# Patient Record
Sex: Male | Born: 1960 | Race: Black or African American | Hispanic: No | Marital: Married | Smoking: Never smoker
Health system: Southern US, Community
[De-identification: ages and names within clinical notes are randomized; demographics above are authoritative.]

## PROBLEM LIST (undated history)

## (undated) DIAGNOSIS — G4730 Sleep apnea, unspecified: Secondary | ICD-10-CM

## (undated) HISTORY — DX: Sleep apnea, unspecified: G47.30

---

## 2003-04-28 ENCOUNTER — Encounter: Admission: EM | Admit: 2003-04-28 | Discharge: 2003-04-28 | Payer: Self-pay | Admitting: Family Medicine

## 2008-01-21 ENCOUNTER — Ambulatory Visit (HOSPITAL_BASED_OUTPATIENT_CLINIC_OR_DEPARTMENT_OTHER): Admission: RE | Admit: 2008-01-21 | Discharge: 2008-01-21 | Payer: Self-pay | Admitting: Family Medicine

## 2018-02-13 ENCOUNTER — Other Ambulatory Visit: Payer: Self-pay | Admitting: Family Medicine

## 2018-02-13 DIAGNOSIS — R1031 Right lower quadrant pain: Secondary | ICD-10-CM

## 2018-02-14 ENCOUNTER — Observation Stay (HOSPITAL_COMMUNITY)
Admission: EM | Admit: 2018-02-14 | Discharge: 2018-02-17 | Disposition: A | Discharge status: Home / Self Care | Payer: BLUE CROSS/BLUE SHIELD | Attending: General Surgery | Admitting: General Surgery

## 2018-02-14 ENCOUNTER — Encounter (HOSPITAL_COMMUNITY): Payer: Self-pay

## 2018-02-14 ENCOUNTER — Other Ambulatory Visit: Payer: Self-pay

## 2018-02-14 ENCOUNTER — Ambulatory Visit
Admission: RE | Admit: 2018-02-14 | Discharge: 2018-02-14 | Disposition: A | Discharge status: Home / Self Care | Payer: BLUE CROSS/BLUE SHIELD | Source: Ambulatory Visit | Attending: Family Medicine | Admitting: Family Medicine

## 2018-02-14 DIAGNOSIS — K3532 Acute appendicitis with perforation and localized peritonitis, without abscess: Principal | ICD-10-CM | POA: Insufficient documentation

## 2018-02-14 DIAGNOSIS — K429 Umbilical hernia without obstruction or gangrene: Secondary | ICD-10-CM | POA: Insufficient documentation

## 2018-02-14 DIAGNOSIS — N289 Disorder of kidney and ureter, unspecified: Secondary | ICD-10-CM | POA: Diagnosis not present

## 2018-02-14 DIAGNOSIS — K3580 Unspecified acute appendicitis: Secondary | ICD-10-CM | POA: Diagnosis present

## 2018-02-14 DIAGNOSIS — R1031 Right lower quadrant pain: Secondary | ICD-10-CM

## 2018-02-14 DIAGNOSIS — E860 Dehydration: Secondary | ICD-10-CM | POA: Diagnosis not present

## 2018-02-14 LAB — COMPREHENSIVE METABOLIC PANEL
ALT: 27 U/L (ref 0–44)
AST: 24 U/L (ref 15–41)
Albumin: 3.4 g/dL — ABNORMAL LOW (ref 3.5–5.0)
Alkaline Phosphatase: 71 U/L (ref 38–126)
Anion gap: 7 (ref 5–15)
BUN: 13 mg/dL (ref 6–20)
CO2: 26 mmol/L (ref 22–32)
Calcium: 9.5 mg/dL (ref 8.9–10.3)
Chloride: 103 mmol/L (ref 98–111)
Creatinine, Ser: 1.28 mg/dL — ABNORMAL HIGH (ref 0.61–1.24)
GFR calc Af Amer: >60 mL/min (ref >60)
GFR calc non Af Amer: >60 mL/min (ref >60)
Glucose, Bld: 120 mg/dL — ABNORMAL HIGH (ref 70–99)
Potassium: 3.6 mmol/L (ref 3.5–5.1)
Sodium: 136 mmol/L (ref 135–145)
Total Bilirubin: 0.5 mg/dL (ref 0.3–1.2)
Total Protein: 7.2 g/dL (ref 6.5–8.1)

## 2018-02-14 LAB — CBC WITH DIFFERENTIAL/PLATELET
Abs Immature Granulocytes: 0.03 10*3/uL (ref 0.00–0.07)
Basophils Absolute: 0 10*3/uL (ref 0.0–0.1)
Basophils Relative: 1 %
Eosinophils Absolute: 0.6 10*3/uL — ABNORMAL HIGH (ref 0.0–0.5)
Eosinophils Relative: 7 %
HCT: 41.1 % (ref 39.0–52.0)
Hemoglobin: 13.4 g/dL (ref 13.0–17.0)
Immature Granulocytes: 0 %
Lymphocytes Relative: 20 %
Lymphs Abs: 1.7 10*3/uL (ref 0.7–4.0)
MCH: 26.1 pg (ref 26.0–34.0)
MCHC: 32.6 g/dL (ref 30.0–36.0)
MCV: 80 fL (ref 80.0–100.0)
Monocytes Absolute: 0.6 10*3/uL (ref 0.1–1.0)
Monocytes Relative: 7 %
Neutro Abs: 5.7 10*3/uL (ref 1.7–7.7)
Neutrophils Relative %: 65 %
Platelets: 281 10*3/uL (ref 150–400)
RBC: 5.14 MIL/uL (ref 4.22–5.81)
RDW: 12.3 % (ref 11.5–15.5)
WBC: 8.6 10*3/uL (ref 4.0–10.5)
nRBC: 0 % (ref 0.0–0.2)

## 2018-02-14 LAB — LIPASE, BLOOD: Lipase: 29 U/L (ref 11–51)

## 2018-02-14 MED ORDER — ONDANSETRON 4 MG PO TBDP: 4.0000 mg | ORAL_TABLET | Freq: Four times a day (QID) | ORAL | Status: DC | PRN

## 2018-02-14 MED ORDER — SODIUM CHLORIDE 0.9 % IV SOLN
2.0000 g | Freq: Once | INTRAVENOUS | Status: AC
  Administered 2018-02-14: 2 g via INTRAVENOUS
  Filled 2018-02-14: qty 20

## 2018-02-14 MED ORDER — POTASSIUM CHLORIDE IN NACL 20-0.9 MEQ/L-% IV SOLN
INTRAVENOUS | Status: DC
  Administered 2018-02-15: via INTRAVENOUS
  Filled 2018-02-14: qty 1000

## 2018-02-14 MED ORDER — IOPAMIDOL (ISOVUE-300) INJECTION 61%
125.0000 mL | Freq: Once | INTRAVENOUS | Status: AC | PRN
  Administered 2018-02-14: 125 mL via INTRAVENOUS

## 2018-02-14 MED ORDER — MORPHINE SULFATE (PF) 2 MG/ML IV SOLN
2.0000 mg | INTRAVENOUS | Status: DC | PRN
  Administered 2018-02-15: 2 mg via INTRAVENOUS
  Filled 2018-02-14: qty 1

## 2018-02-14 MED ORDER — METRONIDAZOLE IN NACL 5-0.79 MG/ML-% IV SOLN
500.0000 mg | Freq: Once | INTRAVENOUS | Status: AC
  Administered 2018-02-14: 500 mg via INTRAVENOUS
  Filled 2018-02-14: qty 100

## 2018-02-14 MED ORDER — ENOXAPARIN SODIUM 40 MG/0.4ML ~~LOC~~ SOLN: 40.0000 mg | Freq: Every day | SUBCUTANEOUS | Status: DC

## 2018-02-14 MED ORDER — CEFTRIAXONE SODIUM 2 G IJ SOLR
2.0000 g | INTRAMUSCULAR | Status: DC
  Filled 2018-02-14: qty 20

## 2018-02-14 MED ORDER — ONDANSETRON HCL 4 MG/2ML IJ SOLN
4.0000 mg | Freq: Four times a day (QID) | INTRAMUSCULAR | Status: DC | PRN
  Administered 2018-02-15: 4 mg via INTRAVENOUS
  Filled 2018-02-14: qty 2

## 2018-02-14 MED ORDER — SODIUM CHLORIDE 0.9 % IV BOLUS
1000.0000 mL | Freq: Once | INTRAVENOUS | Status: AC
  Administered 2018-02-14: 1000 mL via INTRAVENOUS

## 2018-02-14 MED ORDER — METRONIDAZOLE IN NACL 5-0.79 MG/ML-% IV SOLN
500.0000 mg | Freq: Three times a day (TID) | INTRAVENOUS | Status: DC
  Administered 2018-02-15 (×2): 500 mg via INTRAVENOUS
  Filled 2018-02-14 (×3): qty 100

## 2018-02-14 NOTE — ED Triage Notes (Signed)
Pt endorses RLQ pain x 5 days, denies n/v/d or fever. VSS. Afebrile. Thinks his appendix is inflamed.

## 2018-02-14 NOTE — ED Provider Notes (Signed)
Patient placed in Quick Look pathway, seen and evaluated   Chief Complaint: RLQ pain  HPI:   Patient reports right lower quadrant pain for about 5 days.  He went to His PCP today who ordered labs, CT scan.  He had his last oral intake approximately 1 hour ago which was a [place] sandwich water.  He denies any fevers.  He reports that he received a call saying that he needs to come to the emergency room.  CT scan reviewed, shows acute appendicitis.  He has no history of abdominal surgery.  States he does not need nausea or pain medicine at this time.  He reports his urine has been slightly darker.  ROS: No fevers (one)  Physical Exam:   Gen: No distress  Neuro: Awake and Alert  Skin: Warm    Focused Exam: RLQ TTP.     Initiation of care has begun. The patient has been counseled on the process, plan, and necessity for staying for the completion/evaluation, and the remainder of the medical screening examination    Ollen Gross 02/14/18 Dione Booze, DO 02/14/18 2253

## 2018-02-14 NOTE — Progress Notes (Addendum)
Patient was @ [facility] on 02/14/18 for CT scan of abd and pelvis.  With a positive finding of appendicitis, Dr Kenton Kingfisher expressed that the patient be taken by EMS to [facility] did not want to do this and was transported by his wife.

## 2018-02-14 NOTE — ED Provider Notes (Signed)
[facility] EMERGENCY DEPARTMENT Provider Note   CSN: [id number] Arrival date & time: 02/14/18  [place]     History   Chief Complaint Chief Complaint  Patient presents with  . Abdominal Pain    HPI Christopher Clements is a 57 y.o. male.  HPI   57 year old male presents today with reports of appendicitis.  Patient notes 5 days of right lower quadrant abdominal pain.  He notes this has continued to persist.  He was seen by his primary care provider today who drew labs as an outpatient showing a white count of 13, and a CT showing acute appendicitis.  Patient notes that shortly after the CT scan he went and had a full meal (1.5 hours prior to my evaluation).  He denies any associated nausea or vomiting, denies any fever, reports normal bowel movements.  No history of abdominal surgeries, no chronic health conditions and no daily medications.       History reviewed. No pertinent past medical history.  There are no active problems to display for this patient.   History reviewed. No pertinent surgical history.      Home Medications    Prior to Admission medications   Not on File    Family History History reviewed. No pertinent family history.  Social History Social History   Tobacco Use  . Smoking status: Never Smoker  Substance Use Topics  . Alcohol use: Never    Frequency: Never  . Drug use: Never     Allergies   Patient has no known allergies.   Review of Systems Review of Systems  All other systems reviewed and are negative.   Physical Exam Updated Vital Signs BP 134/85 (BP Location: Right Arm)   Pulse 94   Temp 97.9 F (36.6 C) (Oral)   Resp 16   Ht 6\' 2"  (1.88 m)   Wt 124.7 kg   SpO2 99%   BMI 35.31 kg/m   Physical Exam  Constitutional: He is oriented to person, place, and time. He appears well-developed and well-nourished.  HENT:  Head: Normocephalic and atraumatic.  Eyes: Pupils are equal, round, and reactive to light.  Conjunctivae are normal. Right eye exhibits no discharge. Left eye exhibits no discharge. No scleral icterus.  Neck: Normal range of motion. No JVD present. No tracheal deviation present.  Pulmonary/Chest: Effort normal. No stridor.  Abdominal:  Minimal tenderness to palpation of right lower quadrant-remainder of abdomen soft nontender  Neurological: He is alert and oriented to person, place, and time. Coordination normal.  Psychiatric: He has a normal mood and affect. His behavior is normal. Judgment and thought content normal.  Nursing note and vitals reviewed.    ED Treatments / Results  Labs (all labs ordered are listed, but only abnormal results are displayed) Labs Reviewed  COMPREHENSIVE METABOLIC PANEL - Abnormal; Notable for the following components:      Result Value   Glucose, Bld 120 (*)    Creatinine, Ser 1.28 (*)    Albumin 3.4 (*)    All other components within normal limits  CBC WITH DIFFERENTIAL/PLATELET - Abnormal; Notable for the following components:   Eosinophils Absolute 0.6 (*)    All other components within normal limits  LIPASE, BLOOD    EKG None  Radiology Ct Abdomen Pelvis W Contrast  Result Date: 02/14/2018 CLINICAL DATA:  Right lower quadrant pain for 5 days. EXAM: CT ABDOMEN AND PELVIS WITH CONTRAST TECHNIQUE: Multidetector CT imaging of the abdomen and pelvis was performed  using the standard protocol following bolus administration of intravenous contrast. CONTRAST:  182mL ISOVUE-300 IOPAMIDOL (ISOVUE-300) INJECTION 61% COMPARISON:  None. FINDINGS: Lower chest: No acute abnormality. Hepatobiliary: Mild steatosis of the liver without space-occupying mass. No biliary dilatation. Contracted gallbladder without stones. Pancreas: Unremarkable. No pancreatic ductal dilatation or surrounding inflammatory changes. Spleen: Normal in size without focal abnormality. Adrenals/Urinary Tract: Adrenal glands are unremarkable. 8 mm cyst in the interpolar right kidney.  No enhancing mass lesion of either kidney. No nephrolithiasis nor obstructive uropathy is identified. The urinary bladder is unremarkable for the degree of distention. Stomach/Bowel: Abnormal appearance of the appendix. Appendix: Location: The appendix projects medially within the right lower quadrant and upper pelvis. Diameter: Up to 2.4 cm in diameter Appendicolith: There are one or two suggested along the midportion measuring up to 7 mm, series 6/62 and 63. Mucosal hyper-enhancement: Yes Extraluminal gas: No Periappendiceal collection: Moderate degree of periappendiceal inflammation without definite collection noted. Small hiatal hernia. Physiologic appearance of the stomach and small intestine with normal bowel rotation. Moderate stool retention within the colon. No bowel obstruction or inflammation. Vascular/Lymphatic: Enlarged periappendiceal lymph node measuring up to 1 cm short axis, series 2/60 smaller lymph nodes present in the right lower quadrant. Reproductive: Prostate and seminal vesicles are unremarkable. Other: Small amount of fat within both inguinal canals. Periumbilical fat containing hernia with slight indurated appearance of the fat. Incarceration of the fat within the hernia is not excluded. Musculoskeletal: Degenerative disc disease L4-5. No acute nor aggressive osseous lesions. IMPRESSION: Acute appendicitis with the appendix measuring up to 2.4 cm in diameter with moderate degree of periappendiceal inflammation but without definite perforation or abscess. Ancillary findings as above. These results will be called to the ordering clinician or representative by the Radiologist Assistant, and communication documented in the PACS or zVision Dashboard. Electronically Signed   By: Ashley Royalty M.D.   On: 02/14/2018 16:03    Procedures Procedures (including critical care time)  Medications Ordered in ED Medications  cefTRIAXone (ROCEPHIN) 2 g in sodium chloride 0.9 % 100 mL IVPB (0 g  Intravenous Stopped 02/14/18 2003)    And  metroNIDAZOLE (FLAGYL) IVPB 500 mg (500 mg Intravenous New Bag/Given 02/14/18 2018)  sodium chloride 0.9 % bolus 1,000 mL (0 mLs Intravenous Stopped 02/14/18 2034)     Initial Impression / Assessment and Plan / ED Course  I have reviewed the triage vital signs and the nursing notes.  Pertinent labs & imaging results that were available during my care of the patient were reviewed by me and considered in my medical decision making (see chart for details).     Labs: CMP, lipase, CBC  Imaging: CT abdomen pelvis with contrast as an outpatient  Consults:  Therapeutics: Metronidazole, ceftriaxone  Discharge Meds:   Assessment/Plan: 56 year old male presents today with what appears to be acute appendicitis with no signs of significant complications at this time.  Patient is afebrile with no white count with very minimal abdominal pain.  He has a CT showing acute appendicitis.  Patient was started on antibiotics, general surgery consulted to evaluate patient at bedside.    Final Clinical Impressions(s) / ED Diagnoses   Final diagnoses:  Acute appendicitis, unspecified acute appendicitis type    ED Discharge Orders    None       Francee Gentile 02/14/18 2112    Sherwood Gambler, MD 02/14/18 2202

## 2018-02-14 NOTE — H&P (Signed)
Christopher Clements is an 57 y.o. male.   Chief Complaint: RLQ pain HPI: This is a healthy 57 year old male who presents with a 5-6 day history of persistent RLQ abdominal pain.  No nausea or vomiting.  Mildly decreased appetite, although he is more hungry today.  He felt tired and weak over the weekend, so he went to his PCP on Monday.  Reportedly, his WBC was 13 so a CT scan was ordered.  It was not done until today.  He was instructed to come immediately to the ED for evaluation.  No fever.  Last meal about 3 hours ago.    History reviewed. No pertinent past medical history.  History reviewed. No pertinent surgical history.  History reviewed. No pertinent family history. Social History:  reports that he has never smoked. He does not have any smokeless tobacco history on file. He reports that he does not drink alcohol or use drugs.  Allergies: No Known Allergies  Prior to Admission medications   Not on File     Results for orders placed or performed during the hospital encounter of 02/14/18 (from the past 48 hour(s))  Comprehensive metabolic panel     Status: Abnormal   Collection Time: 02/14/18  6:51 PM  Result Value Ref Range   Sodium 136 135 - 145 mmol/L   Potassium 3.6 3.5 - 5.1 mmol/L   Chloride 103 98 - 111 mmol/L   CO2 26 22 - 32 mmol/L   Glucose, Bld 120 (H) 70 - 99 mg/dL   BUN 13 6 - 20 mg/dL   Creatinine, Ser 1.28 (H) 0.61 - 1.24 mg/dL   Calcium 9.5 8.9 - 10.3 mg/dL   Total Protein 7.2 6.5 - 8.1 g/dL   Albumin 3.4 (L) 3.5 - 5.0 g/dL   AST 24 15 - 41 U/L   ALT 27 0 - 44 U/L   Alkaline Phosphatase 71 38 - 126 U/L   Total Bilirubin 0.5 0.3 - 1.2 mg/dL   GFR calc non Af Amer >60 >60 mL/min   GFR calc Af Amer >60 >60 mL/min    Comment: (NOTE) The eGFR has been calculated using the CKD EPI equation. This calculation has not been validated in all clinical situations. eGFR's persistently <60 mL/min signify possible Chronic Kidney Disease.    Anion gap 7 5 - 15    Comment:  Performed at [facility] [address], [place], [place] [address]  Lipase, blood     Status: None   Collection Time: 02/14/18  6:51 PM  Result Value Ref Range   Lipase 29 11 - 51 U/L    Comment: Performed at [facility], [place] [address], [place] [address]  CBC with Differential     Status: Abnormal   Collection Time: 02/14/18  6:51 PM  Result Value Ref Range   WBC 8.6 4.0 - 10.5 K/uL   RBC 5.14 4.22 - 5.81 MIL/uL   Hemoglobin 13.4 13.0 - 17.0 g/dL   HCT 41.1 39.0 - 52.0 %   MCV 80.0 80.0 - 100.0 fL   MCH 26.1 26.0 - 34.0 pg   MCHC 32.6 30.0 - 36.0 g/dL   RDW 12.3 11.5 - 15.5 %   Platelets 281 150 - 400 K/uL   nRBC 0.0 0.0 - 0.2 %   Neutrophils Relative % 65 %   Neutro Abs 5.7 1.7 - 7.7 K/uL   Lymphocytes Relative 20 %   Lymphs Abs 1.7 0.7 -  4.0 K/uL   Monocytes Relative 7 %   Monocytes Absolute 0.6 0.1 - 1.0 K/uL   Eosinophils Relative 7 %   Eosinophils Absolute 0.6 (H) 0.0 - 0.5 K/uL   Basophils Relative 1 %   Basophils Absolute 0.0 0.0 - 0.1 K/uL   Immature Granulocytes 0 %   Abs Immature Granulocytes 0.03 0.00 - 0.07 K/uL    Comment: Performed at [facility] [address], [place], [place] [address]   Ct Abdomen Pelvis W Contrast  Result Date: 02/14/2018 CLINICAL DATA:  Right lower quadrant pain for 5 days. EXAM: CT ABDOMEN AND PELVIS WITH CONTRAST TECHNIQUE: Multidetector CT imaging of the abdomen and pelvis was performed using the standard protocol following bolus administration of intravenous contrast. CONTRAST:  171m ISOVUE-300 IOPAMIDOL (ISOVUE-300) INJECTION 61% COMPARISON:  None. FINDINGS: Lower chest: No acute abnormality. Hepatobiliary: Mild steatosis of the liver without space-occupying mass. No biliary dilatation. Contracted gallbladder without stones. Pancreas: Unremarkable. No pancreatic ductal dilatation or surrounding inflammatory changes. Spleen: Normal in size without focal abnormality. Adrenals/Urinary Tract: Adrenal glands  are unremarkable. 8 mm cyst in the interpolar right kidney. No enhancing mass lesion of either kidney. No nephrolithiasis nor obstructive uropathy is identified. The urinary bladder is unremarkable for the degree of distention. Stomach/Bowel: Abnormal appearance of the appendix. Appendix: Location: The appendix projects medially within the right lower quadrant and upper pelvis. Diameter: Up to 2.4 cm in diameter Appendicolith: There are one or two suggested along the midportion measuring up to 7 mm, series 6/62 and 63. Mucosal hyper-enhancement: Yes Extraluminal gas: No Periappendiceal collection: Moderate degree of periappendiceal inflammation without definite collection noted. Small hiatal hernia. Physiologic appearance of the stomach and small intestine with normal bowel rotation. Moderate stool retention within the colon. No bowel obstruction or inflammation. Vascular/Lymphatic: Enlarged periappendiceal lymph node measuring up to 1 cm short axis, series 2/60 smaller lymph nodes present in the right lower quadrant. Reproductive: Prostate and seminal vesicles are unremarkable. Other: Small amount of fat within both inguinal canals. Periumbilical fat containing hernia with slight indurated appearance of the fat. Incarceration of the fat within the hernia is not excluded. Musculoskeletal: Degenerative disc disease L4-5. No acute nor aggressive osseous lesions. IMPRESSION: Acute appendicitis with the appendix measuring up to 2.4 cm in diameter with moderate degree of periappendiceal inflammation but without definite perforation or abscess. Ancillary findings as above. These results will be called to the ordering clinician or representative by the Radiologist Assistant, and communication documented in the PACS or zVision Dashboard. Electronically Signed   By: DAshley RoyaltyM.D.   On: 02/14/2018 16:03    Review of Systems  Constitutional: Negative for weight loss.  HENT: Negative for ear discharge, ear pain, hearing  loss and tinnitus.   Eyes: Negative for blurred vision, double vision, photophobia and pain.  Respiratory: Negative for cough, sputum production and shortness of breath.   Cardiovascular: Negative for chest pain.  Gastrointestinal: Positive for abdominal pain. Negative for nausea and vomiting.  Genitourinary: Negative for dysuria, flank pain, frequency and urgency.  Musculoskeletal: Negative for back pain, falls, joint pain, myalgias and neck pain.  Neurological: Negative for dizziness, tingling, sensory change, focal weakness, loss of consciousness and headaches.  Endo/Heme/Allergies: Does not bruise/bleed easily.  Psychiatric/Behavioral: Negative for depression, memory loss and substance abuse. The patient is not nervous/anxious.     Blood pressure 134/85, pulse 94, temperature 97.9 F (36.6 C), temperature source Oral, resp. rate 16, height _0  (1.88 m), weight 124.7 kg, SpO2  99 %. Physical Exam  WDWN in NAD Eyes:  Pupils equal, round; sclera anicteric HENT:  Oral mucosa moist; good dentition  Neck:  No masses palpated, no thyromegaly Lungs:  CTA bilaterally; normal respiratory effort CV:  Regular rate and rhythm; no murmurs; extremities well-perfused with no edema Abd:  +bowel sounds, soft, tender in RLQ at McBurney's point.  Reducible umbilical hernia.  No peritonitis Skin:  Warm, dry; no sign of jaundice Psychiatric - alert and oriented x 4; calm mood and affect  Assessment/Plan Acute appendicitis without CT evidence of rupture or abscess Reducible umbilical hernia  Admit for IV hydration, IV abx Laparoscopic appendectomy and primary repair of umbilical hernia tomorrow by Dr. Rosendo Gros.  The surgical procedure has been discussed with the patient.  Potential risks, benefits, alternative treatments, and expected outcomes have been explained.  All of the patient's questions at this time have been answered.  The likelihood of reaching the patient's treatment goal is good.  The patient  understand the proposed surgical procedure and wishes to proceed.   Maia Petties, MD 02/14/2018, 9:36 PM

## 2018-02-15 ENCOUNTER — Observation Stay (HOSPITAL_COMMUNITY): Payer: BLUE CROSS/BLUE SHIELD | Admitting: Certified Registered"

## 2018-02-15 ENCOUNTER — Encounter (HOSPITAL_COMMUNITY)
Admission: EM | Disposition: A | Discharge status: Home / Self Care | Payer: Self-pay | Source: Home / Self Care | Attending: Emergency Medicine

## 2018-02-15 ENCOUNTER — Other Ambulatory Visit: Payer: Self-pay

## 2018-02-15 ENCOUNTER — Encounter (HOSPITAL_COMMUNITY): Payer: Self-pay | Admitting: *Deleted

## 2018-02-15 HISTORY — PX: LAPAROSCOPIC APPENDECTOMY: SHX408

## 2018-02-15 LAB — SURGICAL PCR SCREEN
MRSA, PCR: NEGATIVE
STAPHYLOCOCCUS AUREUS: NEGATIVE

## 2018-02-15 LAB — HIV ANTIBODY (ROUTINE TESTING W REFLEX): HIV Screen 4th Generation wRfx: NONREACTIVE

## 2018-02-15 SURGERY — APPENDECTOMY, LAPAROSCOPIC
Anesthesia: General | Site: Abdomen

## 2018-02-15 MED ORDER — HYDROMORPHONE HCL 1 MG/ML IJ SOLN
0.2500 mg | INTRAMUSCULAR | Status: DC | PRN
  Administered 2018-02-15 (×2): 0.5 mg via INTRAVENOUS

## 2018-02-15 MED ORDER — ONDANSETRON HCL 4 MG/2ML IJ SOLN
INTRAMUSCULAR | Status: DC | PRN
  Administered 2018-02-15: 4 mg via INTRAVENOUS

## 2018-02-15 MED ORDER — DEXMEDETOMIDINE HCL IN NACL 200 MCG/50ML IV SOLN
INTRAVENOUS | Status: DC | PRN
  Administered 2018-02-15: 20 ug via INTRAVENOUS

## 2018-02-15 MED ORDER — SODIUM CHLORIDE 0.9 % IR SOLN
Status: DC | PRN
  Administered 2018-02-15: 1000 mL

## 2018-02-15 MED ORDER — BUPIVACAINE-EPINEPHRINE (PF) 0.25% -1:200000 IJ SOLN
INTRAMUSCULAR | Status: AC
  Filled 2018-02-15: qty 30

## 2018-02-15 MED ORDER — DEXAMETHASONE SODIUM PHOSPHATE 10 MG/ML IJ SOLN
INTRAMUSCULAR | Status: DC | PRN
  Administered 2018-02-15: 10 mg via INTRAVENOUS

## 2018-02-15 MED ORDER — LABETALOL HCL 5 MG/ML IV SOLN
INTRAVENOUS | Status: AC
  Filled 2018-02-15: qty 4

## 2018-02-15 MED ORDER — ONDANSETRON HCL 4 MG/2ML IJ SOLN: 4.0000 mg | Freq: Once | INTRAMUSCULAR | Status: DC | PRN

## 2018-02-15 MED ORDER — DIPHENHYDRAMINE HCL 50 MG/ML IJ SOLN: 25.0000 mg | Freq: Four times a day (QID) | INTRAMUSCULAR | Status: DC | PRN

## 2018-02-15 MED ORDER — BUPIVACAINE HCL 0.25 % IJ SOLN
INTRAMUSCULAR | Status: DC | PRN
  Administered 2018-02-15: 5 mL

## 2018-02-15 MED ORDER — ROCURONIUM BROMIDE 10 MG/ML (PF) SYRINGE: PREFILLED_SYRINGE | INTRAVENOUS | Status: DC | PRN

## 2018-02-15 MED ORDER — FENTANYL CITRATE (PF) 250 MCG/5ML IJ SOLN
INTRAMUSCULAR | Status: AC
  Filled 2018-02-15: qty 5

## 2018-02-15 MED ORDER — SUCCINYLCHOLINE 20MG/ML (10ML) SYRINGE FOR MEDFUSION PUMP - OPTIME
INTRAMUSCULAR | Status: DC | PRN
  Administered 2018-02-15: 140 mg via INTRAVENOUS

## 2018-02-15 MED ORDER — ONDANSETRON HCL 4 MG/2ML IJ SOLN: 4.0000 mg | Freq: Four times a day (QID) | INTRAMUSCULAR | Status: DC | PRN

## 2018-02-15 MED ORDER — KETOROLAC TROMETHAMINE 30 MG/ML IJ SOLN
30.0000 mg | Freq: Once | INTRAMUSCULAR | Status: AC | PRN
  Administered 2018-02-15: 30 mg via INTRAVENOUS

## 2018-02-15 MED ORDER — GABAPENTIN 100 MG PO CAPS
100.0000 mg | ORAL_CAPSULE | Freq: Two times a day (BID) | ORAL | Status: DC
  Administered 2018-02-15 – 2018-02-17 (×5): 100 mg via ORAL
  Filled 2018-02-15 (×5): qty 1

## 2018-02-15 MED ORDER — ZOLPIDEM TARTRATE 5 MG PO TABS: 5.0000 mg | ORAL_TABLET | Freq: Every evening | ORAL | Status: DC | PRN

## 2018-02-15 MED ORDER — ROCURONIUM BROMIDE 10 MG/ML (PF) SYRINGE
PREFILLED_SYRINGE | INTRAVENOUS | Status: DC | PRN
  Administered 2018-02-15: 40 mg via INTRAVENOUS
  Administered 2018-02-15: 10 mg via INTRAVENOUS

## 2018-02-15 MED ORDER — ENOXAPARIN SODIUM 40 MG/0.4ML ~~LOC~~ SOLN
40.0000 mg | SUBCUTANEOUS | Status: DC
  Administered 2018-02-16 – 2018-02-17 (×2): 40 mg via SUBCUTANEOUS
  Filled 2018-02-15 (×2): qty 0.4

## 2018-02-15 MED ORDER — SIMETHICONE 80 MG PO CHEW: 40.0000 mg | CHEWABLE_TABLET | Freq: Four times a day (QID) | ORAL | Status: DC | PRN

## 2018-02-15 MED ORDER — DEXAMETHASONE SODIUM PHOSPHATE 10 MG/ML IJ SOLN
INTRAMUSCULAR | Status: AC
  Filled 2018-02-15: qty 1

## 2018-02-15 MED ORDER — METHOCARBAMOL 500 MG PO TABS
500.0000 mg | ORAL_TABLET | Freq: Four times a day (QID) | ORAL | Status: DC | PRN
  Administered 2018-02-15: 500 mg via ORAL
  Filled 2018-02-15: qty 1

## 2018-02-15 MED ORDER — DOCUSATE SODIUM 100 MG PO CAPS
100.0000 mg | ORAL_CAPSULE | Freq: Two times a day (BID) | ORAL | Status: DC
  Administered 2018-02-15 – 2018-02-17 (×4): 100 mg via ORAL
  Filled 2018-02-15 (×4): qty 1

## 2018-02-15 MED ORDER — FENTANYL CITRATE (PF) 250 MCG/5ML IJ SOLN
INTRAMUSCULAR | Status: DC | PRN
  Administered 2018-02-15: 50 ug via INTRAVENOUS
  Administered 2018-02-15 (×2): 100 ug via INTRAVENOUS

## 2018-02-15 MED ORDER — PROPOFOL 10 MG/ML IV BOLUS
INTRAVENOUS | Status: AC
  Filled 2018-02-15: qty 20

## 2018-02-15 MED ORDER — PROPOFOL 10 MG/ML IV BOLUS
INTRAVENOUS | Status: DC | PRN
  Administered 2018-02-15: 250 mg via INTRAVENOUS

## 2018-02-15 MED ORDER — METRONIDAZOLE IN NACL 5-0.79 MG/ML-% IV SOLN
500.0000 mg | Freq: Once | INTRAVENOUS | Status: AC
  Administered 2018-02-15: 500 mg via INTRAVENOUS
  Filled 2018-02-15: qty 100

## 2018-02-15 MED ORDER — BUPIVACAINE HCL (PF) 0.25 % IJ SOLN
INTRAMUSCULAR | Status: AC
  Filled 2018-02-15: qty 30

## 2018-02-15 MED ORDER — SODIUM CHLORIDE 0.9 % IV SOLN
2.0000 g | Freq: Once | INTRAVENOUS | Status: AC
  Administered 2018-02-15: 2 g via INTRAVENOUS
  Filled 2018-02-15: qty 20

## 2018-02-15 MED ORDER — ROCURONIUM BROMIDE 50 MG/5ML IV SOSY
PREFILLED_SYRINGE | INTRAVENOUS | Status: AC
  Filled 2018-02-15: qty 10

## 2018-02-15 MED ORDER — ONDANSETRON HCL 4 MG/2ML IJ SOLN
INTRAMUSCULAR | Status: AC
  Filled 2018-02-15: qty 2

## 2018-02-15 MED ORDER — POTASSIUM CHLORIDE IN NACL 20-0.9 MEQ/L-% IV SOLN
INTRAVENOUS | Status: DC
  Administered 2018-02-15 – 2018-02-16 (×2): via INTRAVENOUS
  Filled 2018-02-15 (×2): qty 1000

## 2018-02-15 MED ORDER — MORPHINE SULFATE (PF) 2 MG/ML IV SOLN: 1.0000 mg | INTRAVENOUS | Status: DC | PRN

## 2018-02-15 MED ORDER — TRAMADOL HCL 50 MG PO TABS: 50.0000 mg | ORAL_TABLET | Freq: Four times a day (QID) | ORAL | Status: DC | PRN

## 2018-02-15 MED ORDER — MIDAZOLAM HCL 2 MG/2ML IJ SOLN
INTRAMUSCULAR | Status: AC
  Filled 2018-02-15: qty 2

## 2018-02-15 MED ORDER — KETOROLAC TROMETHAMINE 30 MG/ML IJ SOLN
INTRAMUSCULAR | Status: AC
  Filled 2018-02-15: qty 1

## 2018-02-15 MED ORDER — LIDOCAINE 2% (20 MG/ML) 5 ML SYRINGE
INTRAMUSCULAR | Status: AC
  Filled 2018-02-15: qty 5

## 2018-02-15 MED ORDER — SUGAMMADEX SODIUM 200 MG/2ML IV SOLN
INTRAVENOUS | Status: DC | PRN
  Administered 2018-02-15: 250 mg via INTRAVENOUS

## 2018-02-15 MED ORDER — ACETAMINOPHEN 500 MG PO TABS
1000.0000 mg | ORAL_TABLET | Freq: Four times a day (QID) | ORAL | Status: DC
  Administered 2018-02-15 – 2018-02-17 (×8): 1000 mg via ORAL
  Filled 2018-02-15 (×8): qty 2

## 2018-02-15 MED ORDER — ESMOLOL HCL 100 MG/10ML IV SOLN
INTRAVENOUS | Status: AC
  Filled 2018-02-15: qty 10

## 2018-02-15 MED ORDER — MEPERIDINE HCL 50 MG/ML IJ SOLN: 6.2500 mg | INTRAMUSCULAR | Status: DC | PRN

## 2018-02-15 MED ORDER — LACTATED RINGERS IV SOLN
INTRAVENOUS | Status: DC | PRN
  Administered 2018-02-15 (×2): via INTRAVENOUS

## 2018-02-15 MED ORDER — HYDROMORPHONE HCL 1 MG/ML IJ SOLN
INTRAMUSCULAR | Status: AC
  Filled 2018-02-15: qty 1

## 2018-02-15 MED ORDER — 0.9 % SODIUM CHLORIDE (POUR BTL) OPTIME
TOPICAL | Status: DC | PRN
  Administered 2018-02-15: 1000 mL

## 2018-02-15 MED ORDER — MIDAZOLAM HCL 5 MG/5ML IJ SOLN
INTRAMUSCULAR | Status: DC | PRN
  Administered 2018-02-15: 2 mg via INTRAVENOUS

## 2018-02-15 MED ORDER — ONDANSETRON 4 MG PO TBDP: 4.0000 mg | ORAL_TABLET | Freq: Four times a day (QID) | ORAL | Status: DC | PRN

## 2018-02-15 MED ORDER — HYDRALAZINE HCL 20 MG/ML IJ SOLN: 10.0000 mg | INTRAMUSCULAR | Status: DC | PRN

## 2018-02-15 MED ORDER — LIDOCAINE 2% (20 MG/ML) 5 ML SYRINGE
INTRAMUSCULAR | Status: DC | PRN
  Administered 2018-02-15: 100 mg via INTRAVENOUS

## 2018-02-15 MED ORDER — POLYETHYLENE GLYCOL 3350 17 G PO PACK: 17.0000 g | PACK | Freq: Every day | ORAL | Status: DC | PRN

## 2018-02-15 MED ORDER — DIPHENHYDRAMINE HCL 25 MG PO CAPS: 25.0000 mg | ORAL_CAPSULE | Freq: Four times a day (QID) | ORAL | Status: DC | PRN

## 2018-02-15 MED ORDER — STERILE WATER FOR IRRIGATION IR SOLN
Status: DC | PRN
  Administered 2018-02-15: 1000 mL

## 2018-02-15 MED ORDER — METOPROLOL TARTRATE 5 MG/5ML IV SOLN: 5.0000 mg | Freq: Four times a day (QID) | INTRAVENOUS | Status: DC | PRN

## 2018-02-15 MED ORDER — OXYCODONE HCL 5 MG PO TABS
5.0000 mg | ORAL_TABLET | ORAL | Status: DC | PRN
  Administered 2018-02-15 – 2018-02-16 (×2): 5 mg via ORAL
  Filled 2018-02-15 (×2): qty 1

## 2018-02-15 SURGICAL SUPPLY — 47 items
APPLIER CLIP 5 13 M/L LIGAMAX5 (MISCELLANEOUS)
BENZOIN TINCTURE PRP APPL 2/3 (GAUZE/BANDAGES/DRESSINGS) ×2 IMPLANT
BIOPATCH RED 1 DISK 7.0 (GAUZE/BANDAGES/DRESSINGS) ×2 IMPLANT
BLADE CLIPPER SURG (BLADE) ×2 IMPLANT
CANISTER SUCT 3000ML PPV (MISCELLANEOUS) ×2 IMPLANT
CHLORAPREP W/TINT 26ML (MISCELLANEOUS) ×2 IMPLANT
CLIP APPLIE 5 13 M/L LIGAMAX5 (MISCELLANEOUS) IMPLANT
COVER SURGICAL LIGHT HANDLE (MISCELLANEOUS) ×2 IMPLANT
COVER TRANSDUCER ULTRASND (DRAPES) ×2 IMPLANT
COVER WAND RF STERILE (DRAPES) IMPLANT
DRAIN CHANNEL 19F RND (DRAIN) ×2 IMPLANT
DRSG TEGADERM 4X4.75 (GAUZE/BANDAGES/DRESSINGS) ×2 IMPLANT
ELECT REM PT RETURN 9FT ADLT (ELECTROSURGICAL) ×2
ELECTRODE REM PT RTRN 9FT ADLT (ELECTROSURGICAL) ×1 IMPLANT
ENDOLOOP SUT PDS II  0 18 (SUTURE) ×4
ENDOLOOP SUT PDS II 0 18 (SUTURE) ×4 IMPLANT
EVACUATOR SILICONE 100CC (DRAIN) ×2 IMPLANT
GAUZE SPONGE 2X2 8PLY STRL LF (GAUZE/BANDAGES/DRESSINGS) ×1 IMPLANT
GLOVE BIO SURGEON STRL SZ7.5 (GLOVE) ×2 IMPLANT
GOWN STRL REUS W/ TWL LRG LVL3 (GOWN DISPOSABLE) ×2 IMPLANT
GOWN STRL REUS W/ TWL XL LVL3 (GOWN DISPOSABLE) ×1 IMPLANT
GOWN STRL REUS W/TWL LRG LVL3 (GOWN DISPOSABLE) ×2
GOWN STRL REUS W/TWL XL LVL3 (GOWN DISPOSABLE) ×1
GRASPER SUT TROCAR 14GX15 (MISCELLANEOUS) ×2 IMPLANT
KIT BASIN OR (CUSTOM PROCEDURE TRAY) ×2 IMPLANT
KIT TURNOVER KIT B (KITS) ×2 IMPLANT
NEEDLE INSUFFLATION 14GA 120MM (NEEDLE) ×2 IMPLANT
NS IRRIG 1000ML POUR BTL (IV SOLUTION) ×2 IMPLANT
PAD ARMBOARD 7.5X6 YLW CONV (MISCELLANEOUS) ×4 IMPLANT
POUCH RETRIEVAL ECOSAC 10 (ENDOMECHANICALS) ×1 IMPLANT
POUCH RETRIEVAL ECOSAC 10MM (ENDOMECHANICALS) ×1
SCISSORS LAP 5X35 DISP (ENDOMECHANICALS) ×2 IMPLANT
SET IRRIG TUBING LAPAROSCOPIC (IRRIGATION / IRRIGATOR) ×2 IMPLANT
SLEEVE ENDOPATH XCEL 5M (ENDOMECHANICALS) ×2 IMPLANT
SPECIMEN JAR SMALL (MISCELLANEOUS) ×2 IMPLANT
SPONGE GAUZE 2X2 STER 10/PKG (GAUZE/BANDAGES/DRESSINGS) ×1
STRIP CLOSURE SKIN 1/2X4 (GAUZE/BANDAGES/DRESSINGS) ×2 IMPLANT
SUT ETHILON 2 0 FS 18 (SUTURE) ×2 IMPLANT
SUT MNCRL AB 4-0 PS2 18 (SUTURE) ×2 IMPLANT
TOWEL OR 17X24 6PK STRL BLUE (TOWEL DISPOSABLE) ×2 IMPLANT
TOWEL OR 17X26 10 PK STRL BLUE (TOWEL DISPOSABLE) ×2 IMPLANT
TRAY FOLEY CATH SILVER 16FR (SET/KITS/TRAYS/PACK) ×2 IMPLANT
TRAY LAPAROSCOPIC MC (CUSTOM PROCEDURE TRAY) ×2 IMPLANT
TROCAR XCEL NON-BLD 11X100MML (ENDOMECHANICALS) ×2 IMPLANT
TROCAR XCEL NON-BLD 5MMX100MML (ENDOMECHANICALS) ×2 IMPLANT
TUBING INSUFFLATION (TUBING) ×2 IMPLANT
WATER STERILE IRR 1000ML POUR (IV SOLUTION) ×2 IMPLANT

## 2018-02-15 NOTE — Progress Notes (Signed)
    CC: Right lower quadrant pain for 5 to 6 days.  Subjective: Stable, ongoing discomfort right lower quadrant.  Otherwise doing well.  Objective: Vital signs in last 24 hours: Temp:  [97.9 F (36.6 C)-98.3 F (36.8 C)] 98.3 F (36.8 C) (10/16 2305) Pulse Rate:  [73-94] 85 (10/16 2305) Resp:  [16-20] 20 (10/16 2305) BP: (123-138)/(82-97) 138/97 (10/16 2305) SpO2:  [96 %-100 %] 100 % (10/16 2305) Weight:  [124.7 kg] 124.7 kg (10/16 1847)   Afebrile vital signs are stable Admit creatinine 1.28, other labs are normal. CT scan showed acute appendicitis with appendix measuring up to 2.4 cm in diameter with a moderate degree of periappendiceal inflammation without definite perforation or abscess.   Intake/Output from previous day: 10/16 0701 - 10/17 0700 In: 1196.6 [IV Piggyback:1196.6] Out: -  Intake/Output this shift: Total I/O In: 595.9 [I.V.:595.9] Out: 500 [Urine:500]  General appearance: alert, cooperative and no distress Resp: clear to auscultation bilaterally GI: Ongoing pain right lower quadrant.  Lab Results:  Recent Labs    02/14/18 1851  WBC 8.6  HGB 13.4  HCT 41.1  PLT 281    BMET Recent Labs    02/14/18 1851  NA 136  K 3.6  CL 103  CO2 26  GLUCOSE 120*  BUN 13  CREATININE 1.28*  CALCIUM 9.5   PT/INR No results for input(s): LABPROT, INR in the last 72 hours.  Recent Labs  Lab 02/14/18 1851  AST 24  ALT 27  ALKPHOS 71  BILITOT 0.5  PROT 7.2  ALBUMIN 3.4*     Lipase     Component Value Date/Time   LIPASE 29 02/14/2018 1851     Prior to Admission medications   Not on File    Medications: . enoxaparin (LOVENOX) injection  40 mg Subcutaneous Daily   . 0.9 % NaCl with KCl 20 mEq / L 100 mL/hr at 02/15/18 0017  . cefTRIAXone (ROCEPHIN)  IV     And  . metronidazole 500 mg (02/15/18 0059)   Anti-infectives (From admission, onward)   Start     Dose/Rate Route Frequency Ordered Stop   02/15/18 1800  cefTRIAXone (ROCEPHIN) 2  g in sodium chloride 0.9 % 100 mL IVPB     2 g 200 mL/hr over 30 Minutes Intravenous Every 24 hours 02/14/18 2308     02/15/18 0200  metroNIDAZOLE (FLAGYL) IVPB 500 mg     500 mg 100 mL/hr over 60 Minutes Intravenous Every 8 hours 02/14/18 2308     02/14/18 1900  cefTRIAXone (ROCEPHIN) 2 g in sodium chloride 0.9 % 100 mL IVPB     2 g 200 mL/hr over 30 Minutes Intravenous  Once 02/14/18 1848 02/14/18 2003   02/14/18 1900  metroNIDAZOLE (FLAGYL) IVPB 500 mg     500 mg 100 mL/hr over 60 Minutes Intravenous  Once 02/14/18 1848 02/14/18 2118     Assessment/Plan Acute appendicitis with reducible umbilical hernia Mild renal insufficiency/dehydration  - creatinine 1.28 on admission  FEN: IV fluids/n.p.o. ID: Rocephin/Flagyl 02/14/2018 DVT: Lovenox Follow-up: [facility]   Pain: Surgery later today.  IV hydration, antibiotics, recheck labs in a.m.  LOS: 0 days    Christopher Clements 02/15/2018 [phone]

## 2018-02-15 NOTE — Plan of Care (Signed)
  Problem: Pain Managment: Goal: General experience of comfort will improve Outcome: Progressing   

## 2018-02-15 NOTE — Anesthesia Preprocedure Evaluation (Addendum)
Anesthesia Evaluation  Patient identified by MRN, date of birth, ID band Patient awake    Reviewed: Allergy & Precautions, NPO status , Patient's Chart, lab work & pertinent test results  Airway Mallampati: I       Dental no notable dental hx. (+) Teeth Intact   Pulmonary neg pulmonary ROS,    Pulmonary exam normal breath sounds clear to auscultation       Cardiovascular negative cardio ROS Normal cardiovascular exam Rhythm:Regular Rate:Normal     Neuro/Psych negative neurological ROS  negative psych ROS   GI/Hepatic negative GI ROS, Neg liver ROS,   Endo/Other  negative endocrine ROS  Renal/GU negative Renal ROS  negative genitourinary   Musculoskeletal negative musculoskeletal ROS (+)   Abdominal (+) + obese,   Peds  Hematology negative hematology ROS (+)   Anesthesia Other Findings   Reproductive/Obstetrics                             Anesthesia Physical Anesthesia Plan  ASA: II  Anesthesia Plan: General   Post-op Pain Management:    Induction: Intravenous  PONV Risk Score and Plan: 4 or greater and Ondansetron, Dexamethasone and Midazolam  Airway Management Planned: Oral ETT  Additional Equipment:   Intra-op Plan:   Post-operative Plan: Extubation in OR  Informed Consent: I have reviewed the patients History and Physical, chart, labs and discussed the procedure including the risks, benefits and alternatives for the proposed anesthesia with the patient or authorized representative who has indicated his/her understanding and acceptance.   Dental advisory given  Plan Discussed with: CRNA and Surgeon  Anesthesia Plan Comments:        Anesthesia Quick Evaluation

## 2018-02-15 NOTE — Anesthesia Procedure Notes (Signed)
Procedure Name: Intubation Date/Time: 02/15/2018 11:18 AM Performed by: Claris Che, CRNA Pre-anesthesia Checklist: Patient identified, Emergency Drugs available, Suction available, Patient being monitored and Timeout performed Patient Re-evaluated:Patient Re-evaluated prior to induction Oxygen Delivery Method: Circle system utilized Preoxygenation: Pre-oxygenation with 100% oxygen Induction Type: IV induction and Cricoid Pressure applied Ventilation: Mask ventilation without difficulty Laryngoscope Size: Mac and 4 Grade View: Grade II Tube type: Oral Tube size: 7.5 mm Number of attempts: 1 Airway Equipment and Method: Stylet Placement Confirmation: ETT inserted through vocal cords under direct vision,  positive ETCO2 and breath sounds checked- equal and bilateral Secured at: 24 cm Tube secured with: Tape Dental Injury: Teeth and Oropharynx as per pre-operative assessment

## 2018-02-15 NOTE — Transfer of Care (Signed)
Immediate Anesthesia Transfer of Care Note  Patient: Christopher Clements  Procedure(s) Performed: APPENDECTOMY LAPAROSCOPIC (N/A Abdomen)  Patient Location: PACU  Anesthesia Type:General  Level of Consciousness: drowsy  Airway & Oxygen Therapy: Patient Spontanous Breathing and Patient connected to nasal cannula oxygen  Post-op Assessment: Report given to RN and Post -op Vital signs reviewed and stable  Post vital signs: Reviewed and stable  Last Vitals:  Vitals Value Taken Time  BP 123/74 02/15/2018  1:05 PM  Temp    Pulse 82 02/15/2018  1:06 PM  Resp 16 02/15/2018  1:06 PM  SpO2 96 % 02/15/2018  1:06 PM  Vitals shown include unvalidated device data.  Last Pain:  Vitals:   02/15/18 0900  TempSrc:   PainSc: 0-No pain         Complications: No apparent anesthesia complications

## 2018-02-15 NOTE — Op Note (Signed)
02/15/2018  12:49 PM  PATIENT:  Christopher Clements  57 y.o. male  PRE-OPERATIVE DIAGNOSIS:  Acute appendicitis  POST-OPERATIVE DIAGNOSIS:  Acute perforated, appendicitis  PROCEDURE:  Procedure(s): APPENDECTOMY LAPAROSCOPIC (N/A)  SURGEON:  Surgeon(s) and Role:    Ralene Ok, MD - Primary  ANESTHESIA:   local and general  EBL:  40 mL   BLOOD ADMINISTERED:none  DRAINS: (19Fr) Jackson-Pratt drain(s) with closed bulb suction in the RLQ   LOCAL MEDICATIONS USED:  BUPIVICAINE   SPECIMEN:  Source of Specimen:  appendix  DISPOSITION OF SPECIMEN:  PATHOLOGY  COUNTS:  YES  TOURNIQUET:  * No tourniquets in log *  DICTATION: .Dragon Dictation Complications: none  Counts: reported as correct x 2  Findings:  The patient had an acutely inflamed perforated appendix  Specimen: Appendix  Indications for procedure:  The patient is a 57 year old male with a history of periumbilical pain  localized in the right lower quadrant x 5d.  patient had a CT scan which revealed signs consistent with acute appendicitis the patient back in for laparoscopic appendectomy.  Details of the procedure:The patient was taken back to the operating room. The patient was placed in supine position with bilateral SCDs in place.  A foley catheter was place. The patient was prepped and draped in the usual sterile fashion.  After appropriate anitbiotics were confirmed, a time-out was confirmed and all facts were verified.    A pneumoperitoneum of 14 mmHg was obtained via a Veress needle technique in the left lower quadrant quadrant.  A 5 mm trocar and 5 mm camera then placed intra-abdominally there is no injury to any intra-abdominal organs a 10 mm infraumbilical port was placed and direct visualization as was a 5 mm port in the suprapubic area.   The appendix was identified and seen to be perforated. It was densely inflamed and attached to the lateral abdominal wall. The appendix was dissected away from the  surrounding tissue and small bowel.   The appendix was cleaned down to the appendiceal base.  It was transected because the appendix was densely adherhent to the lateral abdominal wall.    At this time an Endoloop was placed proximallyx2 and one distally and the appendix was transected between these 2.  At this time the appendix was peeled away from the abdominal wall and with cautery to maintain hemostasis.  A retrieval bag was then placed into the abdomen and the specimen placed in the bag. The appendiceal stump was cauterized. We evacuate the fluid from the pelvis until the effluent was clear.  The appendix and retrieval  bag was then retrieved via the supraumbilical port. #1 Vicryl was used to reapproximate the fascia at the umbilical port site x2 and repaired the umbilical hernia. The skin was reapproximated all port sites 3-0 Monocryl subcuticular fashion. The skin was dressed with Dermabond.  The patient had the foley removed. The patient was awakened from general anesthesia was taken to recovery room in stable condition.      PLAN OF CARE: Admit for overnight observation  PATIENT DISPOSITION:  PACU - hemodynamically stable.   Delay start of Pharmacological VTE agent (>24hrs) due to surgical blood loss or risk of bleeding: no

## 2018-02-16 ENCOUNTER — Encounter (HOSPITAL_COMMUNITY): Payer: Self-pay | Admitting: General Surgery

## 2018-02-16 LAB — BASIC METABOLIC PANEL
ANION GAP: 8 (ref 5–15)
BUN: 11 mg/dL (ref 6–20)
CALCIUM: 9.2 mg/dL (ref 8.9–10.3)
CO2: 23 mmol/L (ref 22–32)
Chloride: 106 mmol/L (ref 98–111)
Creatinine, Ser: 1.13 mg/dL (ref 0.61–1.24)
GFR calc Af Amer: >60 mL/min (ref >60)
GFR calc non Af Amer: >60 mL/min (ref >60)
GLUCOSE: 99 mg/dL (ref 70–99)
Potassium: 4.2 mmol/L (ref 3.5–5.1)
Sodium: 137 mmol/L (ref 135–145)

## 2018-02-16 LAB — CBC
HEMATOCRIT: 37.9 % — AB (ref 39.0–52.0)
Hemoglobin: 12.2 g/dL — ABNORMAL LOW (ref 13.0–17.0)
MCH: 25.7 pg — ABNORMAL LOW (ref 26.0–34.0)
MCHC: 32.2 g/dL (ref 30.0–36.0)
MCV: 79.8 fL — AB (ref 80.0–100.0)
NRBC: 0 % (ref 0.0–0.2)
Platelets: 281 10*3/uL (ref 150–400)
RBC: 4.75 MIL/uL (ref 4.22–5.81)
RDW: 12.4 % (ref 11.5–15.5)
WBC: 12.7 10*3/uL — ABNORMAL HIGH (ref 4.0–10.5)

## 2018-02-16 MED ORDER — AMOXICILLIN-POT CLAVULANATE 875-125 MG PO TABS
1.0000 | ORAL_TABLET | Freq: Two times a day (BID) | ORAL | Status: DC
  Administered 2018-02-16 – 2018-02-17 (×3): 1 via ORAL
  Filled 2018-02-16 (×3): qty 1

## 2018-02-16 NOTE — Progress Notes (Signed)
Pt educated of how to care for JP drain, pt able to demonstrate how to empty, charge and measure drainage.

## 2018-02-16 NOTE — Progress Notes (Signed)
1 Day Post-Op    CC: Right lower quadrant pain for 5 to 6 days  Subjective: He is up and seems to be doing fairly well.  He is tolerating clears.  Drain is cloudy serosanguinous fluid, sites look fine.    Objective: Vital signs in last 24 hours: Temp:  [97.5 F (36.4 C)-98.7 F (37.1 C)] 98.4 F (36.9 C) (10/18 0837) Pulse Rate:  [73-92] 73 (10/18 0837) Resp:  [13-20] 16 (10/18 0333) BP: (108-134)/(66-84) 123/74 (10/18 0837) SpO2:  [87 %-100 %] 100 % (10/18 0837) Last BM Date: 02/14/18 240 p.o. 3200 IV 800 urine 95 from the drain Afebrile vital signs are stable WBC 12.7, H/H: 12.2/37.9. Creatinine is down to 1.13. Intake/Output from previous day: 10/17 0701 - 10/18 0700 In: 3345.2 [P.O.:240; I.V.:2698.7; IV Piggyback:156.5] Out: 935 [Urine:800; Drains:95; Blood:40] Intake/Output this shift: No intake/output data recorded.  General appearance: alert, cooperative and no distress Resp: clear to auscultation bilaterally GI: soft sore, sites OK Drain is cloudy serosanguinous fluid  Lab Results:  Recent Labs    02/14/18 1851 02/16/18 0152  WBC 8.6 12.7*  HGB 13.4 12.2*  HCT 41.1 37.9*  PLT 281 281    BMET Recent Labs    02/14/18 1851 02/16/18 0152  NA 136 137  K 3.6 4.2  CL 103 106  CO2 26 23  GLUCOSE 120* 99  BUN 13 11  CREATININE 1.28* 1.13  CALCIUM 9.5 9.2   PT/INR No results for input(s): LABPROT, INR in the last 72 hours.  Recent Labs  Lab 02/14/18 1851  AST 24  ALT 27  ALKPHOS 71  BILITOT 0.5  PROT 7.2  ALBUMIN 3.4*     Lipase     Component Value Date/Time   LIPASE 29 02/14/2018 1851     Medications: . acetaminophen  1,000 mg Oral Q6H  . docusate sodium  100 mg Oral BID  . enoxaparin (LOVENOX) injection  40 mg Subcutaneous Q24H  . gabapentin  100 mg Oral BID   . 0.9 % NaCl with KCl 20 mEq / L 75 mL/hr at 02/16/18 0903   Anti-infectives (From admission, onward)   Start     Dose/Rate Route Frequency Ordered Stop   02/15/18 1800   cefTRIAXone (ROCEPHIN) 2 g in sodium chloride 0.9 % 100 mL IVPB  Status:  Discontinued     2 g 200 mL/hr over 30 Minutes Intravenous Every 24 hours 02/14/18 2308 02/15/18 1454   02/15/18 1500  cefTRIAXone (ROCEPHIN) 2 g in sodium chloride 0.9 % 100 mL IVPB     2 g 200 mL/hr over 30 Minutes Intravenous  Once 02/15/18 1500 02/15/18 1626   02/15/18 1500  metroNIDAZOLE (FLAGYL) IVPB 500 mg     500 mg 100 mL/hr over 60 Minutes Intravenous  Once 02/15/18 1500 02/15/18 1813   02/15/18 0200  metroNIDAZOLE (FLAGYL) IVPB 500 mg  Status:  Discontinued     500 mg 100 mL/hr over 60 Minutes Intravenous Every 8 hours 02/14/18 2308 02/15/18 1454   02/14/18 1900  cefTRIAXone (ROCEPHIN) 2 g in sodium chloride 0.9 % 100 mL IVPB     2 g 200 mL/hr over 30 Minutes Intravenous  Once 02/14/18 1848 02/14/18 2003   02/14/18 1900  metroNIDAZOLE (FLAGYL) IVPB 500 mg     500 mg 100 mL/hr over 60 Minutes Intravenous  Once 02/14/18 1848 02/14/18 2118      Assessment/Plan Acute perforated appendicitis with reducible umbilical hernia Laparoscopic appendectomy with drain placement, 02/15/2018 Dr. Ralene Ok. Mild  renal insufficiency/dehydration  - creatinine 1.28 >> 1.13 WBC 8.6 >>12.7  FEN: IV fluids/clears ID: Rocephin/Flagyl 10/16-10/17/19; Augmentin 10/18 x 5 day DVT: Lovenox Follow-up: [facility]  Plan: Advance diet, mobilize, starting Augmentin this a.m. if he does really well he may be able to go home later today.  I told him we will check on him later and see how he is doing.  He has 2 appointments one next Tuesday for drain removal.,  Then 1 the following week for second follow-up.  I will recheck his labs again in the a.m. if he does not go home.     LOS: 0 days    Charliene Inoue 02/16/2018 [phone]

## 2018-02-16 NOTE — Discharge Instructions (Signed)
[facility] ° °Please arrive at least 30 min before your appointment to complete your check in paperwork.  If you are unable to arrive 30 min prior to your appointment time we may have to cancel or reschedule you. °LAPAROSCOPIC SURGERY: POST OP INSTRUCTIONS °Always review your discharge instruction sheet given to you by the facility where your surgery was performed. °IF YOU HAVE DISABILITY OR FAMILY LEAVE FORMS, YOU MUST BRING THEM TO THE OFFICE FOR PROCESSING.   °DO NOT GIVE THEM TO YOUR DOCTOR. ° °PAIN CONTROL ° °1. First take acetaminophen (Tylenol) AND/or ibuprofen (Advil) to control your pain after surgery.  Follow directions on package.  Taking acetaminophen (Tylenol) and/or ibuprofen (Advil) regularly after surgery will help to control your pain and lower the amount of prescription pain medication you may need.  You should not take more than 4,000 mg (4 grams) of acetaminophen (Tylenol) in 24 hours.  You should not take ibuprofen (Advil), aleve, motrin, naprosyn or other NSAIDS if you have a history of stomach ulcers or chronic kidney disease.  °2. A prescription for pain medication may be given to you upon discharge.  Take your pain medication as prescribed, if you still have uncontrolled pain after taking acetaminophen (Tylenol) or ibuprofen (Advil). °3. Use ice packs to help control pain. °4. If you need a refill on your pain medication, please contact your pharmacy.  They will contact our office to request authorization. Prescriptions will not be filled after 5pm or on week-ends. ° °HOME MEDICATIONS °5. Take your usually prescribed medications unless otherwise directed. ° °DIET °6. You should follow a light diet the first few days after arrival home.  Be sure to include lots of fluids daily. Avoid fatty, fried foods.  ° °CONSTIPATION °7. It is common to experience some constipation after surgery and if you are taking pain medication.  Increasing fluid intake and taking a stool  softener (such as Colace) will usually help or prevent this problem from occurring.  A mild laxative (Milk of Magnesia or Miralax) should be taken according to package instructions if there are no bowel movements after 48 hours. ° °WOUND/INCISION CARE °8. Most patients will experience some swelling and bruising in the area of the incisions.  Ice packs will help.  Swelling and bruising can take several days to resolve.  °9. Unless discharge instructions indicate otherwise, follow guidelines below  °a. STERI-STRIPS - you may remove your outer bandages 48 hours after surgery, and you may shower at that time.  You have steri-strips (small skin tapes) in place directly over the incision.  These strips should be left on the skin for 7-10 days.   °b. DERMABOND/SKIN GLUE - you may shower in 24 hours.  The glue will flake off over the next 2-3 weeks. °10. Any sutures or staples will be removed at the office during your follow-up visit. ° °ACTIVITIES °11. You may resume regular (light) daily activities beginning the next day--such as daily self-care, walking, climbing stairs--gradually increasing activities as tolerated.  You may have sexual intercourse when it is comfortable.  Refrain from any heavy lifting or straining until approved by your doctor. °a. You may drive when you are no longer taking prescription pain medication, you can comfortably wear a seatbelt, and you can safely maneuver your car and apply brakes. ° °FOLLOW-UP °12. You should see your doctor in the office for a follow-up appointment approximately 2-3 weeks after your surgery.  You should have been given your post-op/follow-up appointment when   your surgery was scheduled.  If you did not receive a post-op/follow-up appointment, make sure that you call for this appointment within a day or two after you arrive home to insure a convenient appointment time.  OTHER INSTRUCTIONS   WHEN TO CALL YOUR DOCTOR: 1. Fever over 101.0 2. Inability to  urinate 3. Continued bleeding from incision. 4. Increased pain, redness, or drainage from the incision. 5. Increasing abdominal pain  The clinic staff is available to answer your questions during regular business hours.  Please dont hesitate to call and ask to speak to one of the nurses for clinical concerns.  If you have a medical emergency, go to the nearest emergency room or call 911.  A surgeon from [facility] is always on call at the hospital. [address] [place], [place], [place]  [address] ? [address], [place], [place]   [address] [phone] ? [phone] ? FAX [phone]   [facility] Empty the drain and record color and volume of drainage, bring with you for follow up. Any big change in volume or color of drainage: call that day and let the staff know. Surgical drains are used to remove extra fluid that normally builds up in a surgical wound after surgery. A surgical drain helps to heal a surgical wound. Different kinds of surgical drains include:  Active drains. These drains use suction to pull drainage away from the surgical wound. Drainage flows through a tube to a container outside of the body. It is important to keep the bulb or the drainage container flat (compressed) at all times, except while you empty it. Flattening the bulb or container creates suction. The two most common types of active drains are bulb drains and Hemovac drains.  Passive drains. These drains allow fluid to drain naturally, by gravity. Drainage flows through a tube to a bandage (dressing) or a container outside of the body. Passive drains do not need to be emptied. The most common type of passive drain is the Penrose drain.  A drain is placed during surgery. Immediately after surgery, drainage is usually bright red and a little thicker than water. The drainage may gradually turn yellow or pink and become thinner. It is likely that your health care provider will  remove the drain when the drainage stops or when the amount decreases to 1-2 Tbsp (15-30 mL) during a 24-hour period. How to care for your surgical drain  Keep the skin around the drain dry and covered with a dressing at all times.  Check your drain area every day for signs of infection. Check for: ? More redness, swelling, or pain. ? Pus or a bad smell. ? Cloudy drainage. Follow instructions from your health care provider about how to take care of your drain and how to change your dressing. Change your dressing at least one time every day. Change it more often if needed to keep the dressing dry. Make sure you: 1. Gather your supplies, including: ? Tape. ? Germ-free cleaning solution (sterile saline). ? Split gauze drain sponge: 4 x 4 inches (10 x 10 cm). ? Gauze square: 4 x 4 inches (10 x 10 cm). 2. Wash your hands with soap and water before you change your dressing. If soap and water are not available, use hand sanitizer. 3. Remove the old dressing. Avoid using scissors to do that. 4. Use sterile saline to clean your skin around the drain. 5. Place the tube through the slit in a drain  sponge. Place the drain sponge so that it covers your wound. 6. Place the gauze square or another drain sponge on top of the drain sponge that is on the wound. Make sure the tube is between those layers. 7. Tape the dressing to your skin. 8. If you have an active bulb or Hemovac drain, tape the drainage tube to your skin 1-2 inches (2.5-5 cm) below the place where the tube enters your body. Taping keeps the tube from pulling on any stitches (sutures) that you have. 9. Wash your hands with soap and water. 10. Write down the color of your drainage and how often you change your dressing.  How to empty your active bulb or Hemovac drain 1. Make sure that you have a measuring cup that you can empty your drainage into. 2. Wash your hands with soap and water. If soap and water are not available, use hand  sanitizer. 3. Gently move your fingers down the tube while squeezing very lightly. This is called stripping the tube. This clears any drainage, clots, or tissue from the tube. ? Do not pull on the tube. ? You may need to strip the tube several times every day to keep the tube clear. 4. Open the bulb cap or the drain plug. Do not touch the inside of the cap or the bottom of the plug. 5. Empty all of the drainage into the measuring cup. 6. Compress the bulb or the container and replace the cap or the plug. To compress the bulb or the container, squeeze it firmly in the middle while you close the cap or plug the container. 7. Write down the amount of drainage that you have in each 24-hour period. If you have less than 2 Tbsp (30 mL) of drainage during 24 hours, contact your health care provider. 8. Flush the drainage down the toilet. 9. Wash your hands with soap and water. Contact a health care provider if:  You have more redness, swelling, or pain around your drain area.  The amount of drainage that you have is increasing instead of decreasing.  You have pus or a bad smell coming from your drain area.  You have a fever.  You have drainage that is cloudy.  There is a sudden stop or a sudden decrease in the amount of drainage that you have.  Your tube falls out.  Your active draindoes not stay compressedafter you empty it. This information is not intended to replace advice given to you by your health care provider. Make sure you discuss any questions you have with your health care provider. Document Released: 04/15/2000 Document Revised: 09/24/2015 Document Reviewed: 11/05/2014 Elsevier Interactive Patient Education  2018 [employer].

## 2018-02-17 LAB — CBC
HCT: 38.1 % — ABNORMAL LOW (ref 39.0–52.0)
Hemoglobin: 11.8 g/dL — ABNORMAL LOW (ref 13.0–17.0)
MCH: 25 pg — ABNORMAL LOW (ref 26.0–34.0)
MCHC: 31 g/dL (ref 30.0–36.0)
MCV: 80.7 fL (ref 80.0–100.0)
NRBC: 0 % (ref 0.0–0.2)
Platelets: 273 10*3/uL (ref 150–400)
RBC: 4.72 MIL/uL (ref 4.22–5.81)
RDW: 12.5 % (ref 11.5–15.5)
WBC: 9.2 10*3/uL (ref 4.0–10.5)

## 2018-02-17 LAB — BASIC METABOLIC PANEL
ANION GAP: 7 (ref 5–15)
BUN: 13 mg/dL (ref 6–20)
CHLORIDE: 106 mmol/L (ref 98–111)
CO2: 25 mmol/L (ref 22–32)
Calcium: 9 mg/dL (ref 8.9–10.3)
Creatinine, Ser: 1.19 mg/dL (ref 0.61–1.24)
GFR calc non Af Amer: >60 mL/min (ref >60)
GLUCOSE: 95 mg/dL (ref 70–99)
POTASSIUM: 3.7 mmol/L (ref 3.5–5.1)
Sodium: 138 mmol/L (ref 135–145)

## 2018-02-17 MED ORDER — OXYCODONE HCL 5 MG PO TABS: ORAL_TABLET | ORAL | 0 refills | Status: DC

## 2018-02-17 MED ORDER — ACETAMINOPHEN 500 MG PO TABS: ORAL_TABLET | ORAL | 0 refills | Status: DC

## 2018-02-17 MED ORDER — AMOXICILLIN-POT CLAVULANATE 875-125 MG PO TABS: 1.0000 | ORAL_TABLET | Freq: Two times a day (BID) | ORAL | 0 refills | Status: DC

## 2018-02-17 NOTE — Progress Notes (Signed)
Patient up slowly to [facility] early this am. Patient's eyes rolled and lost consciousness briefly, diaphoretic, denied chest pain and dyspnea during event.  Lift back in bed by 4 staff members, regained consciousness.   BP 1220/72, resp 20, HR 72 lying down post event.  Rapid response alerted, labs done without changes noted.  Denies complaints presently.   Vitals remain stable.  Trauma PA paged awaiting response.

## 2018-02-17 NOTE — Progress Notes (Signed)
2 Days Post-Op    QI:WLNLG lower quadrant pain for 5 to 6 days  Subjective: He is feeling better.  Tolerating diet.  Port sites all look good.  JP drainage is still serosanguineous and somewhat cloudy.  We discussed postop care and he is anxious to go home today.  Objective: Vital signs in last 24 hours: Temp:  [98 F (36.7 C)-98.5 F (36.9 C)] 98.5 F (36.9 C) (10/19 0508) Pulse Rate:  [76-97] 76 (10/19 0508) Resp:  [16] 16 (10/19 0508) BP: (137-175)/(83-92) 137/83 (10/19 0508) SpO2:  [99 %] 99 % (10/19 0508) Last BM Date: 02/14/18 720 PO 956 IV 700 urine 190 drain Afebrile, VSS WBC is stable back down to 9.2 H/H OK, Creatinine is stable at 1.19   Intake/Output from previous day: 10/18 0701 - 10/19 0700 In: 1676.1 [P.O.:720; I.V.:956.1] Out: 890 [Urine:700; Drains:190] Intake/Output this shift: Total I/O In: -  Out: 65 [Drains:65]  General appearance: alert, cooperative and no distress Resp: clear to auscultation bilaterally GI: Normal postop soreness.  Port sites look fine.  Drainage from JP is still's somewhat cloudy serosanguineous fluid.  Lab Results:  Recent Labs    02/16/18 0152 02/17/18 0458  WBC 12.7* 9.2  HGB 12.2* 11.8*  HCT 37.9* 38.1*  PLT 281 273    BMET Recent Labs    02/16/18 0152 02/17/18 0458  NA 137 138  K 4.2 3.7  CL 106 106  CO2 23 25  GLUCOSE 99 95  BUN 11 13  CREATININE 1.13 1.19  CALCIUM 9.2 9.0   PT/INR No results for input(s): LABPROT, INR in the last 72 hours.  Recent Labs  Lab 02/14/18 1851  AST 24  ALT 27  ALKPHOS 71  BILITOT 0.5  PROT 7.2  ALBUMIN 3.4*     Lipase     Component Value Date/Time   LIPASE 29 02/14/2018 1851     Medications: . acetaminophen  1,000 mg Oral Q6H  . amoxicillin-clavulanate  1 tablet Oral Q12H  . docusate sodium  100 mg Oral BID  . enoxaparin (LOVENOX) injection  40 mg Subcutaneous Q24H  . gabapentin  100 mg Oral BID    Assessment/Plan Acute perforated appendicitis with  reducible umbilical hernia Laparoscopic appendectomy with drain placement, 02/15/2018 Dr. Ralene Ok. Mild renal insufficiency/dehydration-creatinine 1.28 >> 1.13 WBC 8.6 >>12.7  FEN: IV fluids/clears ID: Rocephin/Flagyl 10/16-10/17/19; Augmentin 10/18 x 5 day DVT: Lovenox Follow-up:[facility]  Plan: Home today with 5 days of antibiotics.  Follow-up in our office next week for drain removal.  We discussed the risk and concerns with perforated appendix.I have personally reviewed the patients medication history on the [place] controlled substance database.      LOS: 0 days    Madisun Hargrove 02/17/2018 [phone]

## 2018-02-17 NOTE — Plan of Care (Signed)
  Problem: Activity: Goal: Risk for activity intolerance will decrease Outcome: Progressing   Problem: Nutrition: Goal: Adequate nutrition will be maintained Outcome: Progressing   Problem: Pain Managment: Goal: General experience of comfort will improve Outcome: Progressing   Problem: Safety: Goal: Ability to remain free from injury will improve Outcome: Progressing   

## 2018-02-17 NOTE — Progress Notes (Signed)
Provided discharge education/instructions, all questions and concerns addressed, Pt to discharge home with belongings accompanied by his friend.

## 2018-02-19 NOTE — Discharge Summary (Signed)
Physician Discharge Summary  Patient ID: Christopher Clements MRN: [id number] DOB/AGE: Jun 12, 1960 57 y.o.  Admit date: 02/14/2018 Discharge date: 02/17/2018  Admission Diagnoses:  Acute appendicitis Dehydration Mild renal insufficiency  Discharge Diagnoses:  Acuteperforatedappendicitis with reducible umbilical hernia Dehydration Mild renal insufficiency  Active Problems:   Acute appendicitis   PROCEDURES: Laparoscopic appendectomy with drain placement, 02/15/2018, Dr. Marchia Meiers Course: Patient is a 58 year old male who presented with a 5 to 6-day history of persistent right lower quadrant pain.  He denied nausea or vomiting.  He said a slightly decreased appetite he is felt weak and tired over the weekend saw his primary care physician on Monday.  Work-up showed an elevated white count.  CT scan was not completed until 02/14/2018.  Patient was admitted by Dr. Donnie Mesa and placed on IV antibiotics, IV hydration and made n.p.o.  He was taken the operating room the following day but on 02/15/2018 by Dr. Rosendo Gros.  He was found to have a perforated appendix.  Patient underwent laparoscopy and drain placement.  Return to the floor.  His diet was advanced on the first postoperative day but his white count was still elevated.  We kept him on p.o. antibiotics postop and by the second postoperative day he was ready for discharge.  He was discharged home on 5 more days of Augmentin with a drain.  He returned to the office next week for drain removal. Then in 2 weeks for routine follow-up.  The risk and symptoms of post operative abscess were reviewed with the patient.  He had some mild renal insufficiency noted on admission.  This is most likely related to dehydration.  He has no prior history of renal disease or hypertension.  His creatinine returned to normal with just IV hydration.  Because of the elevated creatinine we held off on NSAID drugs for pain relief.  CBC Latest Ref Rng &  Units 02/17/2018 02/16/2018 02/14/2018  WBC 4.0 - 10.5 K/uL 9.2 12.7(H) 8.6  Hemoglobin 13.0 - 17.0 g/dL 11.8(L) 12.2(L) 13.4  Hematocrit 39.0 - 52.0 % 38.1(L) 37.9(L) 41.1  Platelets 150 - 400 K/uL 273 281 281   CMP Latest Ref Rng & Units 02/17/2018 02/16/2018 02/14/2018  Glucose 70 - 99 mg/dL 95 99 120(H)  BUN 6 - 20 mg/dL 13 11 13   Creatinine 0.61 - 1.24 mg/dL 1.19 1.13 1.28(H)  Sodium 135 - 145 mmol/L 138 137 136  Potassium 3.5 - 5.1 mmol/L 3.7 4.2 3.6  Chloride 98 - 111 mmol/L 106 106 103  CO2 22 - 32 mmol/L 25 23 26   Calcium 8.9 - 10.3 mg/dL 9.0 9.2 9.5  Total Protein 6.5 - 8.1 g/dL - - 7.2  Total Bilirubin 0.3 - 1.2 mg/dL - - 0.5  Alkaline Phos 38 - 126 U/L - - 71  AST 15 - 41 U/L - - 24  ALT 0 - 44 U/L - - 27    Condition on discharge: Improved   Disposition: Home   Allergies as of 02/17/2018   No Known Allergies     Medication List    TAKE these medications   acetaminophen 500 MG tablet Commonly known as:  TYLENOL You can take 1000 mg 6 hours as needed for pain.  You can buy this over-the-counter at any drugstore.  Do not exceed 4000 mg of Tylenol(acetaminophen) per day it can harm your liver.   amoxicillin-clavulanate 875-125 MG tablet Commonly known as:  AUGMENTIN Take 1 tablet by mouth every 12 (twelve) hours.  oxyCODONE 5 MG immediate release tablet Commonly known as:  Oxy IR/ROXICODONE You can take 1 tablet every 6 hours for pain not relieved by plain Tylenol (acetaminophen.)      Follow-up Information    [facility] Follow up on 02/20/2018.   Specialty:  General Surgery Why:  Your appointment for Drain removal 02/20/18 @ 1:30 PM.  Be at the office 30 minutes early for check-in bring photo ID and insurance information.  Second Follow up on 10/31 at 2 PM.  Contact information: [address] [place] [address] [phone]        Donald Prose, MD Follow up.   Specialty:  Family Medicine Why:  CALL AND LET THEM KNOW  YOU HAD SURGERY, FOLLOW UP FOR MEDICAL ISSUES. Contact information: [address] [place] [address] [phone]           Signed: Earnstine Regal 02/19/2018, 2:18 PM

## 2018-02-21 NOTE — Anesthesia Postprocedure Evaluation (Signed)
Anesthesia Post Note  Patient: Christopher Clements  Procedure(s) Performed: APPENDECTOMY LAPAROSCOPIC (N/A Abdomen)     Patient location during evaluation: PACU Anesthesia Type: General Level of consciousness: awake Pain management: pain level controlled Vital Signs Assessment: post-procedure vital signs reviewed and stable Respiratory status: spontaneous breathing Cardiovascular status: stable Postop Assessment: no apparent nausea or vomiting Anesthetic complications: no    Last Vitals:  Vitals:   02/16/18 1334 02/17/18 0508  BP: (!) 140/92 137/83  Pulse: 90 76  Resp:  16  Temp: 36.7 C 36.9 C  SpO2: 99% 99%    Last Pain:  Vitals:   02/17/18 0928  TempSrc:   PainSc: 3    Pain Goal: Patients Stated Pain Goal: 3 (02/16/18 1231)               Huston Foley

## 2020-12-28 ENCOUNTER — Other Ambulatory Visit: Payer: Self-pay | Admitting: Physician Assistant

## 2020-12-28 DIAGNOSIS — M79651 Pain in right thigh: Secondary | ICD-10-CM

## 2021-01-07 ENCOUNTER — Other Ambulatory Visit: Payer: Self-pay

## 2021-01-07 ENCOUNTER — Ambulatory Visit
Admission: RE | Admit: 2021-01-07 | Discharge: 2021-01-07 | Disposition: A | Discharge status: Home / Self Care | Payer: 59 | Source: Ambulatory Visit | Attending: Physician Assistant | Admitting: Physician Assistant

## 2021-01-07 DIAGNOSIS — M79651 Pain in right thigh: Secondary | ICD-10-CM

## 2022-06-13 ENCOUNTER — Ambulatory Visit (INDEPENDENT_AMBULATORY_CARE_PROVIDER_SITE_OTHER): Payer: 59 | Admitting: Family Medicine

## 2022-06-13 ENCOUNTER — Encounter: Payer: Self-pay | Admitting: Family Medicine

## 2022-06-13 VITALS — BP 136/82 | HR 70 | Temp 97.2°F | Ht 74.0 in | Wt 287.2 lb

## 2022-06-13 DIAGNOSIS — Z0000 Encounter for general adult medical examination without abnormal findings: Secondary | ICD-10-CM

## 2022-06-13 DIAGNOSIS — G4733 Obstructive sleep apnea (adult) (pediatric): Secondary | ICD-10-CM

## 2022-06-13 DIAGNOSIS — Z6836 Body mass index (BMI) 36.0-36.9, adult: Secondary | ICD-10-CM

## 2022-06-13 LAB — COMPREHENSIVE METABOLIC PANEL
ALT: 16 U/L (ref 0–53)
AST: 14 U/L (ref 0–37)
Albumin: 4.4 g/dL (ref 3.5–5.2)
Alkaline Phosphatase: 57 U/L (ref 39–117)
BUN: 12 mg/dL (ref 6–23)
CO2: 26 mEq/L (ref 19–32)
Calcium: 10.1 mg/dL (ref 8.4–10.5)
Chloride: 103 mEq/L (ref 96–112)
Creatinine, Ser: 1.2 mg/dL (ref 0.40–1.50)
GFR: 65.39 mL/min (ref >60.00)
Glucose, Bld: 87 mg/dL (ref 70–99)
Potassium: 3.9 mEq/L (ref 3.5–5.1)
Sodium: 138 mEq/L (ref 135–145)
Total Bilirubin: 0.6 mg/dL (ref 0.2–1.2)
Total Protein: 7.2 g/dL (ref 6.0–8.3)

## 2022-06-13 LAB — URINALYSIS, ROUTINE W REFLEX MICROSCOPIC
Bilirubin Urine: NEGATIVE
Hgb urine dipstick: NEGATIVE
Ketones, ur: NEGATIVE
Leukocytes,Ua: NEGATIVE
Nitrite: NEGATIVE
RBC / HPF: NONE SEEN (ref 0–?)
Specific Gravity, Urine: 1.025 (ref 1.000–1.030)
Total Protein, Urine: NEGATIVE
Urine Glucose: NEGATIVE
Urobilinogen, UA: 0.2 (ref 0.0–1.0)
WBC, UA: NONE SEEN (ref 0–?)
pH: 6 (ref 5.0–8.0)

## 2022-06-13 LAB — LIPID PANEL
Cholesterol: 192 mg/dL (ref 0–200)
HDL: 49.3 mg/dL (ref >39.00)
LDL Cholesterol: 125 mg/dL — ABNORMAL HIGH (ref 0–99)
NonHDL: 143.07
Total CHOL/HDL Ratio: 4
Triglycerides: 88 mg/dL (ref 0.0–149.0)
VLDL: 17.6 mg/dL (ref 0.0–40.0)

## 2022-06-13 LAB — CBC
HCT: 43 % (ref 39.0–52.0)
Hemoglobin: 14.1 g/dL (ref 13.0–17.0)
MCHC: 32.7 g/dL (ref 30.0–36.0)
MCV: 80.5 fl (ref 78.0–100.0)
Platelets: 196 10*3/uL (ref 150.0–400.0)
RBC: 5.34 Mil/uL (ref 4.22–5.81)
RDW: 13.6 % (ref 11.5–15.5)
WBC: 4.8 10*3/uL (ref 4.0–10.5)

## 2022-06-13 LAB — PSA: PSA: 1.35 ng/mL (ref 0.10–4.00)

## 2022-06-13 NOTE — Progress Notes (Signed)
Established Patient Office Visit   Subjective:  Patient ID: Christopher Clements, male    DOB: 08-22-1960  Age: 62 y.o. MRN: [id number]  Chief Complaint  Patient presents with   Establish Care    NP/establish care would like referral for colonoscopy discuss diabetes. Patient fasting.     HPI Encounter Diagnoses  Name Primary?   Healthcare maintenance Yes   BMI 36.0-36.9,adult    Obstructive sleep apnea syndrome    Now with new insurance.  Has been here to 4 lost to follow-up.  He now has access to dental care.  He has been exercising and walking for 20 to 30 minutes.  He lives with his wife.  Not sleeping well.  He does have a history of apnea.  His mask has not been fitting well.  He has no sleep doctor at this time.   Review of Systems  Constitutional: Negative.   HENT: Negative.    Eyes:  Negative for blurred vision, discharge and redness.  Respiratory: Negative.    Cardiovascular: Negative.   Gastrointestinal:  Negative for abdominal pain, blood in stool, constipation and melena.  Genitourinary: Negative.   Musculoskeletal: Negative.  Negative for myalgias.  Skin:  Negative for rash.  Neurological:  Negative for tingling, loss of consciousness and weakness.  Endo/Heme/Allergies:  Negative for polydipsia.      06/13/2022   10:57 AM  Depression screen PHQ 2/9  Decreased Interest 0  Down, Depressed, Hopeless 0  PHQ - 2 Score 0     No current outpatient medications on file.   Objective:     BP 136/82 (BP Location: Left Arm, Patient Position: Sitting, Cuff Size: Large)   Pulse 70   Temp (!) 97.2 F (36.2 C) (Temporal)   Ht 6' 2"$  (1.88 m)   Wt 287 lb 3.2 oz (130.3 kg)   SpO2 98%   BMI 36.87 kg/m    Physical Exam Constitutional:      General: He is not in acute distress.    Appearance: Normal appearance. He is not ill-appearing, toxic-appearing or diaphoretic.  HENT:     Head: Normocephalic and atraumatic.     Right Ear: Tympanic membrane, ear canal and  external ear normal.     Left Ear: Tympanic membrane, ear canal and external ear normal.     Mouth/Throat:     Mouth: Mucous membranes are moist.     Pharynx: Oropharynx is clear. No oropharyngeal exudate or posterior oropharyngeal erythema.   Eyes:     General: No scleral icterus.       Right eye: No discharge.        Left eye: No discharge.     Extraocular Movements: Extraocular movements intact.     Conjunctiva/sclera: Conjunctivae normal.     Pupils: Pupils are equal, round, and reactive to light.  Cardiovascular:     Rate and Rhythm: Normal rate and regular rhythm.  Pulmonary:     Effort: Pulmonary effort is normal. No respiratory distress.     Breath sounds: Normal breath sounds.  Abdominal:     General: Bowel sounds are normal. There is no distension.     Tenderness: There is no abdominal tenderness. There is no right CVA tenderness, left CVA tenderness, guarding or rebound.  Musculoskeletal:     Cervical back: No rigidity or tenderness.  Skin:    General: Skin is warm and dry.  Neurological:     Mental Status: He is alert and oriented to person, place, and time.  Psychiatric:        Mood and Affect: Mood normal.        Behavior: Behavior normal.      No results found for any visits on 06/13/22.    The ASCVD Risk score (Arnett DK, et al., 2019) failed to calculate for the following reasons:   Cannot find a previous HDL lab   Cannot find a previous total cholesterol lab    Assessment & Plan:   Healthcare maintenance -     CBC -     Comprehensive metabolic panel -     Lipid panel -     PSA -     Urinalysis, Routine w reflex microscopic -     Ambulatory referral to Gastroenterology  BMI 36.0-36.9,adult -     Amb ref to Medical Nutrition Therapy-MNT  Obstructive sleep apnea syndrome -     Ambulatory referral to Pulmonology    Return in about 6 months (around 12/12/2022), or May need sooner appointment depending on results of labs..  Continue exercising  by walking for 30 minutes daily.  Information was given on exercising to lose weight.  Information given on BMI as well as health maintenance and disease prevention.  Referred to nutritionist for weight loss ideas.  Referred to pulm for follow-up of sleep apnea.  Believe this could be associated with his difficulty sleeping.  Libby Maw, MD

## 2022-06-15 ENCOUNTER — Other Ambulatory Visit: Payer: Self-pay

## 2022-06-15 DIAGNOSIS — Z0000 Encounter for general adult medical examination without abnormal findings: Secondary | ICD-10-CM

## 2022-06-15 LAB — HEMOGLOBIN A1C: Hgb A1c MFr Bld: 6.1 % (ref 4.6–6.5)

## 2022-06-15 NOTE — Addendum Note (Signed)
Addended by: Lynda Rainwater on: 06/15/2022 03:13 PM   Modules accepted: Orders

## 2022-06-15 NOTE — Addendum Note (Signed)
Addended by: Lynda Rainwater on: 06/15/2022 03:55 PM   Modules accepted: Orders

## 2022-06-16 ENCOUNTER — Ambulatory Visit (AMBULATORY_SURGERY_CENTER): Payer: 59

## 2022-06-16 ENCOUNTER — Encounter: Payer: Self-pay | Admitting: Gastroenterology

## 2022-06-16 VITALS — Ht 74.0 in | Wt 285.0 lb

## 2022-06-16 DIAGNOSIS — Z1211 Encounter for screening for malignant neoplasm of colon: Secondary | ICD-10-CM

## 2022-06-16 MED ORDER — NA SULFATE-K SULFATE-MG SULF 17.5-3.13-1.6 GM/177ML PO SOLN: 1.0000 | Freq: Once | ORAL | 0 refills | Status: AC

## 2022-06-16 NOTE — Progress Notes (Signed)
No egg or soy allergy known to patient  No issues known to pt with past sedation with any surgeries or procedures Patient denies ever being told they had issues or difficulty with intubation  No FH of Malignant Hyperthermia Pt is not on diet pills Pt is not on home 02  Pt is not on blood thinners  Pt denies issues with constipation  No A fib or A flutter Have any cardiac testing pending--NO Pt instructed to use Single[employer]com or GoodRx for a price reduction on prep   

## 2022-06-21 ENCOUNTER — Encounter: Payer: 59 | Admitting: Gastroenterology

## 2022-06-23 ENCOUNTER — Ambulatory Visit (AMBULATORY_SURGERY_CENTER): Payer: 59 | Admitting: Gastroenterology

## 2022-06-23 ENCOUNTER — Encounter: Payer: Self-pay | Admitting: Gastroenterology

## 2022-06-23 VITALS — BP 122/67 | HR 74 | Temp 97.3°F | Resp 10 | Ht 74.0 in | Wt 275.0 lb

## 2022-06-23 DIAGNOSIS — D123 Benign neoplasm of transverse colon: Secondary | ICD-10-CM

## 2022-06-23 DIAGNOSIS — Z1211 Encounter for screening for malignant neoplasm of colon: Secondary | ICD-10-CM | POA: Diagnosis present

## 2022-06-23 DIAGNOSIS — D120 Benign neoplasm of cecum: Secondary | ICD-10-CM

## 2022-06-23 DIAGNOSIS — D124 Benign neoplasm of descending colon: Secondary | ICD-10-CM

## 2022-06-23 MED ORDER — SODIUM CHLORIDE 0.9 % IV SOLN: 500.0000 mL | Freq: Once | INTRAVENOUS | Status: DC

## 2022-06-23 NOTE — Patient Instructions (Signed)
Handout on polyps given to patient. Await pathology results. Resume previous diet and continue present medications No Ibuprofen, naproxen, aspirin, or any other non-steroidal anti-inflammatory medications for 2 weeks following polyp removal  Repeat colonoscopy for surveillance will be determined based off of pathology results  YOU HAD AN ENDOSCOPIC PROCEDURE TODAY AT [facility]:   Refer to the procedure report that was given to you for any specific questions about what was found during the examination.  If the procedure report does not answer your questions, please call your gastroenterologist to clarify.  If you requested that your care partner not be given the details of your procedure findings, then the procedure report has been included in a sealed envelope for you to review at your convenience later.  YOU SHOULD EXPECT: Some feelings of bloating in the abdomen. Passage of more gas than usual.  Walking can help get rid of the air that was put into your GI tract during the procedure and reduce the bloating. If you had a lower endoscopy (such as a colonoscopy or flexible sigmoidoscopy) you may notice spotting of blood in your stool or on the toilet paper. If you underwent a bowel prep for your procedure, you may not have a normal bowel movement for a few days.  Please Note:  You might notice some irritation and congestion in your nose or some drainage.  This is from the oxygen used during your procedure.  There is no need for concern and it should clear up in a day or so.  SYMPTOMS TO REPORT IMMEDIATELY:  Following lower endoscopy (colonoscopy or flexible sigmoidoscopy):  Excessive amounts of blood in the stool  Significant tenderness or worsening of abdominal pains  Swelling of the abdomen that is new, acute  Fever of 100F or higher  For urgent or emergent issues, a gastroenterologist can be reached at any hour by calling [phone]. Do not use MyChart messaging for  urgent concerns.    DIET:  We do recommend a small meal at first, but then you may proceed to your regular diet.  Drink plenty of fluids but you should avoid alcoholic beverages for 24 hours.  ACTIVITY:  You should plan to take it easy for the rest of today and you should NOT DRIVE or use heavy machinery until tomorrow (because of the sedation medicines used during the test).    FOLLOW UP: Our staff will call the number listed on your records the next business day following your procedure.  We will call around 7:15- 8:00 am to check on you and address any questions or concerns that you may have regarding the information given to you following your procedure. If we do not reach you, we will leave a message.     If any biopsies were taken you will be contacted by phone or by letter within the next 1-3 weeks.  Please call us at [phone] if you have not heard about the biopsies in 3 weeks.    SIGNATURES/CONFIDENTIALITY: You and/or your care partner have signed paperwork which will be entered into your electronic medical record.  These signatures attest to the fact that that the information above on your After Visit Summary has been reviewed and is understood.  Full responsibility of the confidentiality of this discharge information lies with you and/or your care-partner.

## 2022-06-23 NOTE — Progress Notes (Signed)
Called to room to assist during endoscopic procedure.  Patient ID and intended procedure confirmed with present staff. Received instructions for my participation in the procedure from the performing physician.  

## 2022-06-23 NOTE — Progress Notes (Signed)
Pt resting comfortably. VSS. Airway intact. SBAR complete to RN. All questions answered.   

## 2022-06-23 NOTE — Progress Notes (Signed)
See 06/13/2022 H&P, no changes

## 2022-06-23 NOTE — Op Note (Signed)
[facility] Patient Name: Christopher Clements Procedure Date: 06/23/2022 1:17 PM MRN: [id number] Endoscopist: Ladene Artist , MD, [id number] Age: 62 Referring MD:  Date of Birth: Apr 26, 1961 Gender: Male Account #: [id number] Procedure:                Colonoscopy Indications:              Screening for colorectal malignant neoplasm Medicines:                Monitored Anesthesia Care Procedure:                Pre-Anesthesia Assessment:                           - Prior to the procedure, a History and Physical                            was performed, and patient medications and                            allergies were reviewed. The patient's tolerance of                            previous anesthesia was also reviewed. The risks                            and benefits of the procedure and the sedation                            options and risks were discussed with the patient.                            All questions were answered, and informed consent                            was obtained. Prior Anticoagulants: The patient has                            taken no anticoagulant or antiplatelet agents. ASA                            Grade Assessment: II - A patient with mild systemic                            disease. After reviewing the risks and benefits,                            the patient was deemed in satisfactory condition to                            undergo the procedure.                           After obtaining informed consent, the colonoscope  was passed under direct vision. Throughout the                            procedure, the patient's blood pressure, pulse, and                            oxygen saturations were monitored continuously. The                            CF [id number] was introduced through the anus                            and advanced to the the cecum, identified by                            appendiceal orifice  and ileocecal valve. The                            ileocecal valve, appendiceal orifice, and rectum                            were photographed. The quality of the bowel                            preparation was good. The colonoscopy was performed                            without difficulty. The patient tolerated the                            procedure well. Scope In: 1:41:38 PM Scope Out: 2:04:54 PM Scope Withdrawal Time: 0 hours 19 minutes 28 seconds  Total Procedure Duration: 0 hours 23 minutes 16 seconds  Findings:                 The perianal and digital rectal examinations were                            normal.                           A 10 mm polyp was found in the transverse colon.                            The polyp was pedunculated. The polyp was removed                            with a hot snare. Resection and retrieval were                            complete.                           Six sessile polyps were found in the descending  colon (2), transverse colon (2) and cecum (2). The                            polyps were 5 to 8 mm in size. These polyps were                            removed with a cold snare. Resection and retrieval                            were complete.                           The exam was otherwise without abnormality on                            direct and retroflexion views. Complications:            No immediate complications. Estimated blood loss:                            None. Estimated Blood Loss:     Estimated blood loss: none. Impression:               - One 10 mm polyp in the transverse colon, removed                            with a hot snare. Resected and retrieved.                           - Six 5 to 8 mm polyps in the descending colon, in                            the transverse colon and in the cecum, removed with                            a cold snare. Resected and retrieved.                            - The examination was otherwise normal on direct                            and retroflexion views. Recommendation:           - Repeat colonoscopy after studies are complete for                            surveillance based on pathology results.                           - Patient has a contact number available for                            emergencies. The signs and symptoms of potential  delayed complications were discussed with the                            patient. Return to normal activities tomorrow.                            Written discharge instructions were provided to the                            patient.                           - Resume previous diet.                           - Continue present medications.                           - Await pathology results.                           - No aspirin, ibuprofen, naproxen, or other                            non-steroidal anti-inflammatory drugs for 2 weeks                            after polyp removal. Ladene Artist, MD 06/23/2022 2:10:34 PM This report has been signed electronically.

## 2022-06-24 ENCOUNTER — Telehealth: Payer: Self-pay

## 2022-06-24 NOTE — Telephone Encounter (Signed)
  Follow up Call-     06/23/2022   12:48 PM  Call back number  Post procedure Call Back phone  # [phone]  Permission to leave phone message Yes     Patient questions:  Do you have a fever, pain , or abdominal swelling? No. Pain Score  0 *  Have you tolerated food without any problems? Yes.    Have you been able to return to your normal activities? Yes.    Do you have any questions about your discharge instructions: Diet   No. Medications  No. Follow up visit  No.  Do you have questions or concerns about your Care? No.  Actions: * If pain score is 4 or above: No action needed, pain <4.

## 2022-06-29 ENCOUNTER — Telehealth: Payer: Self-pay | Admitting: Family Medicine

## 2022-06-29 NOTE — Telephone Encounter (Signed)
Pt want to discuss his colonoscopy results with you that he seen on mychart

## 2022-06-30 ENCOUNTER — Telehealth: Payer: Self-pay | Admitting: Gastroenterology

## 2022-06-30 NOTE — Telephone Encounter (Signed)
The pt has been advised that results have not been reviewed as of this afternoon. We will contact him as soon as able.

## 2022-06-30 NOTE — Telephone Encounter (Signed)
Inbound call from patient requesting a call back to request pathology results please advise.  Thank you

## 2022-07-01 NOTE — Telephone Encounter (Signed)
Returned patients call regarding results for colonoscopy per patient he reached out to GI to see if they would go over results patient was told that the doctor have not had a chance to view as of yet. Offered virtual to discuss results with Dr. Ethelene Hal patient states that he will wait for GI to give him a cal because he can not schedule an appointment right now.

## 2022-07-04 ENCOUNTER — Encounter: Payer: Self-pay | Admitting: Gastroenterology

## 2022-12-24 IMAGING — US US EXTREM LOW VENOUS*R*
1 series · 14 of 24 positions shown · non-contrast
Comparison: CT abdomen, 02/14/2018.

CLINICAL DATA: RIGHT thigh pain.

EXAM:
RIGHT LOWER EXTREMITY VENOUS DOPPLER ULTRASOUND
TECHNIQUE: Gray-scale sonography with compression, as well as color and duplex
ultrasound, were performed to evaluate the deep venous system(s)
from the level of the common femoral vein through the popliteal and
proximal calf veins.

[Series 1: us extrem low venous*right* · 0.08mm/px · 14 of 34 slices shown]
[im 1/34]
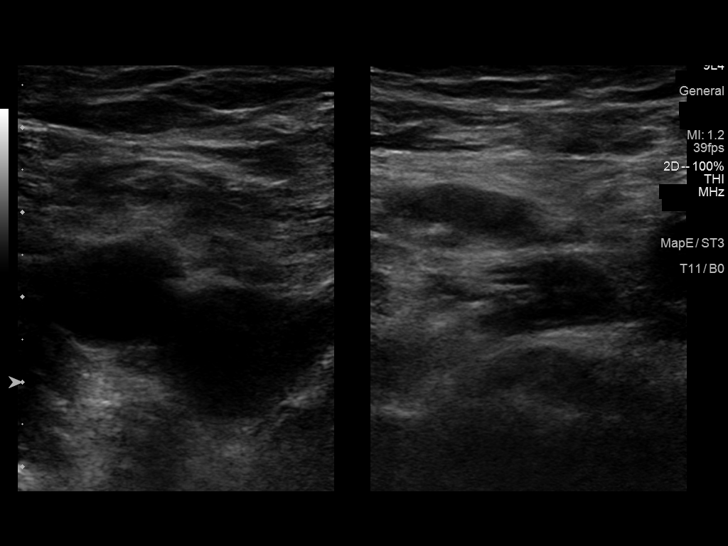
[im 3/34]
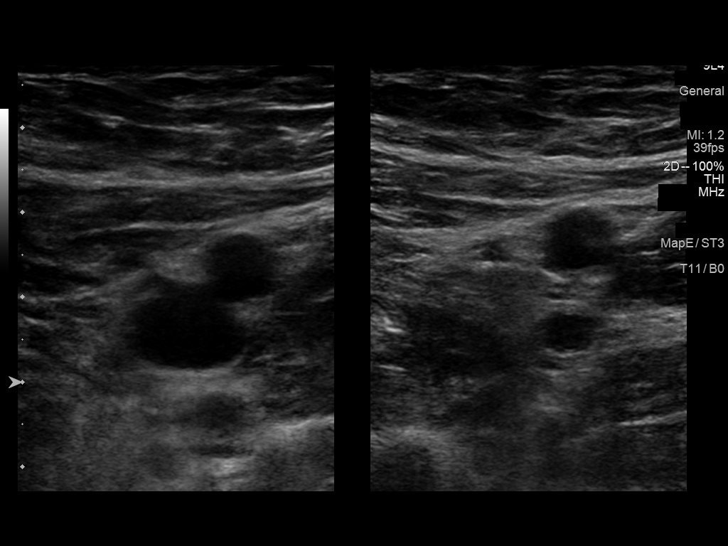
[im 6/34]
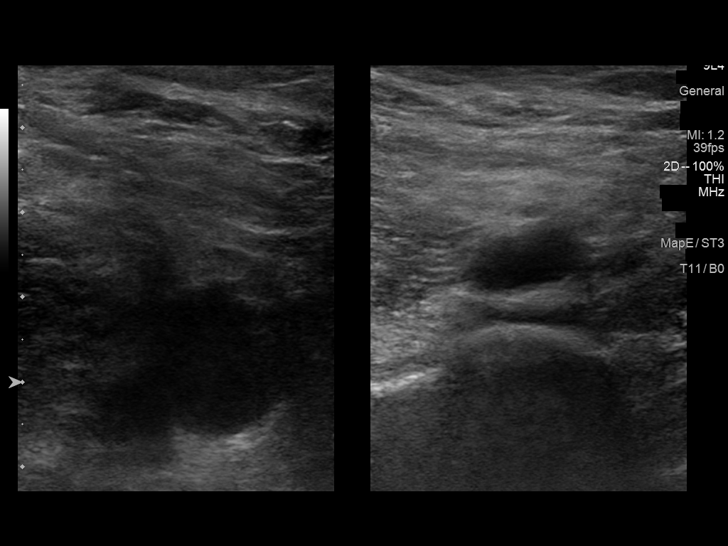
[im 9/34]
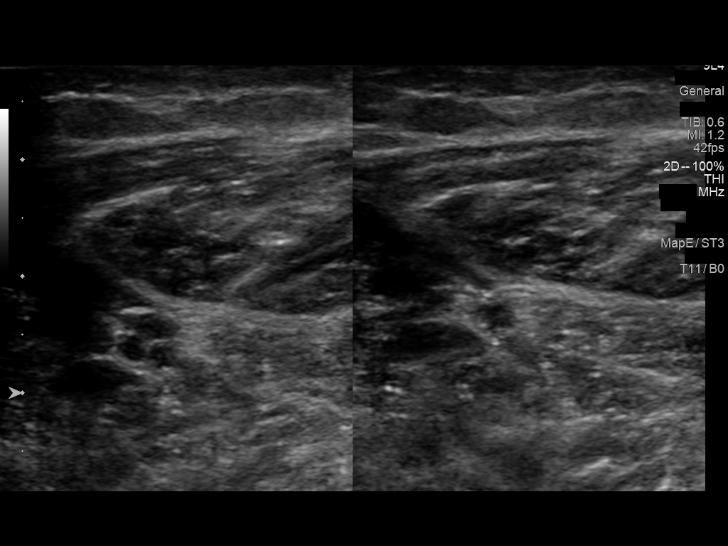
[im 11/34]
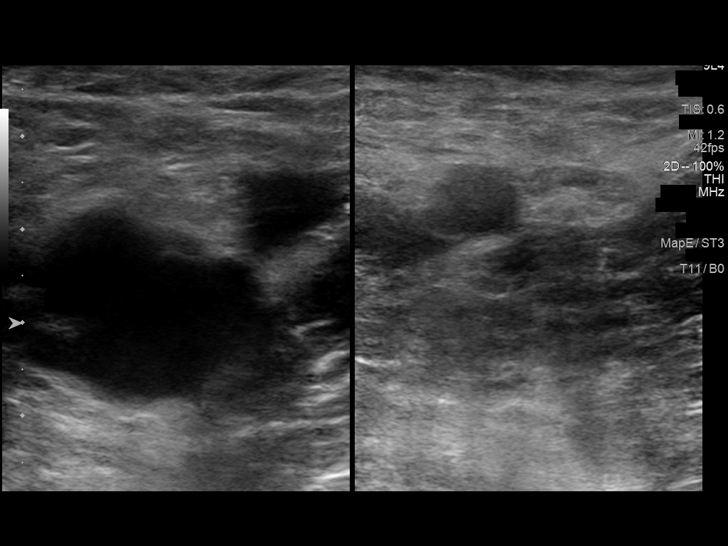
[im 13/34]
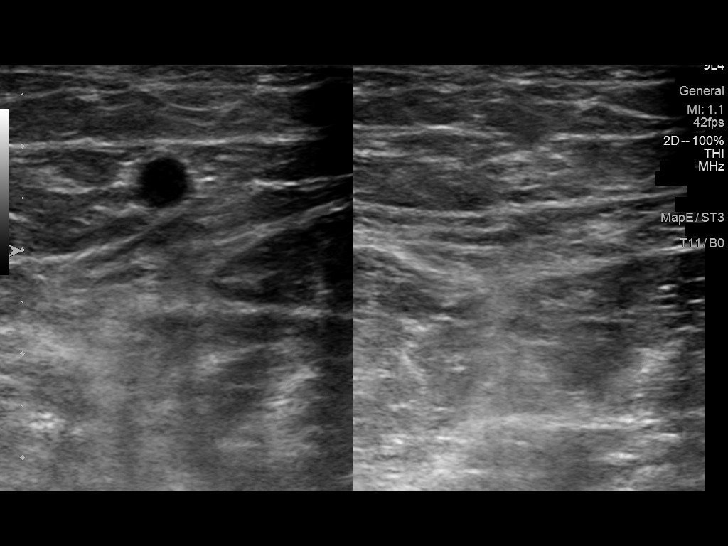
[im 16/34]
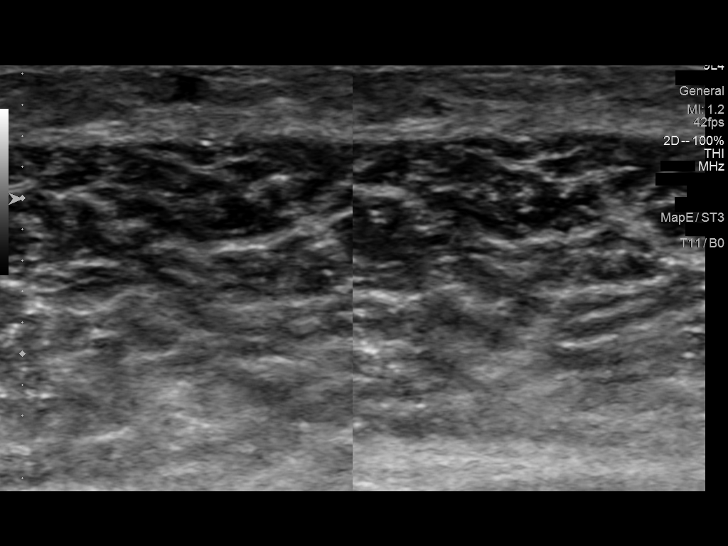
[im 18/34]
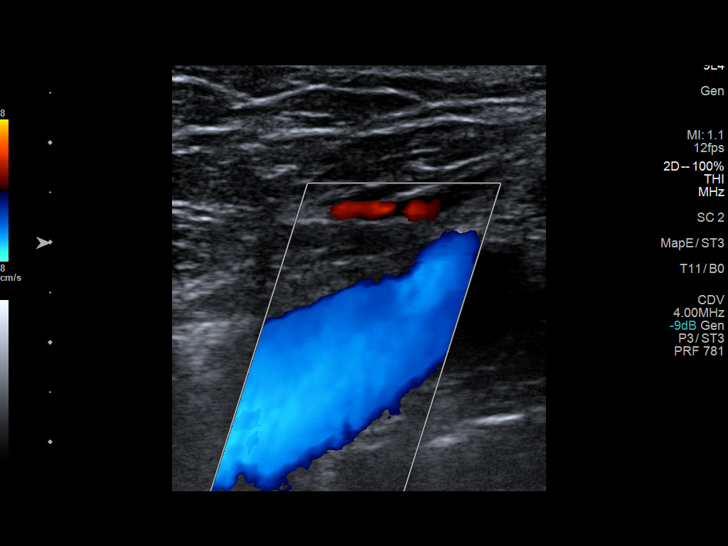
[im 21/34]
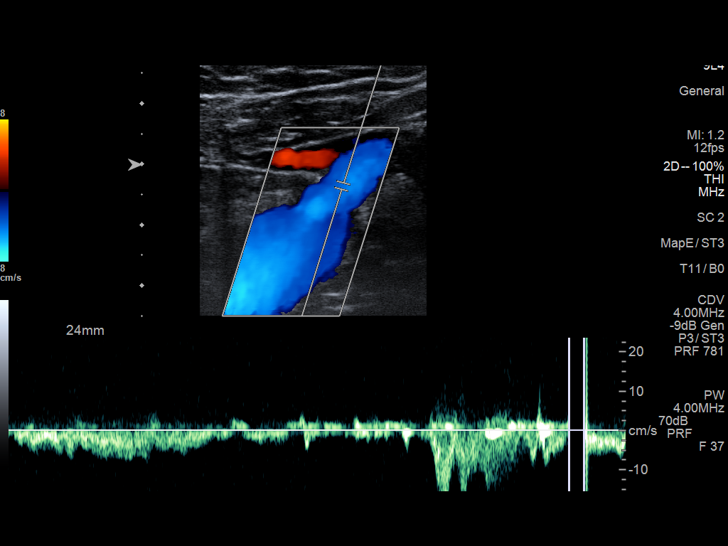
[im 23/34]
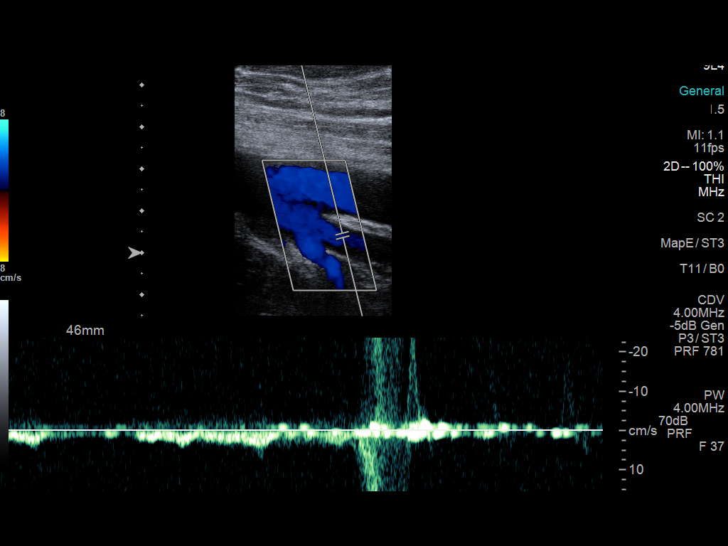
[im 26/34]
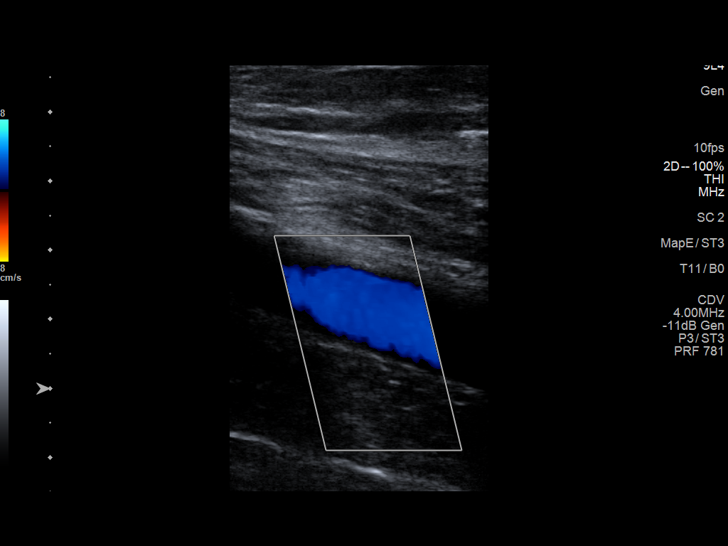
[im 28/34]
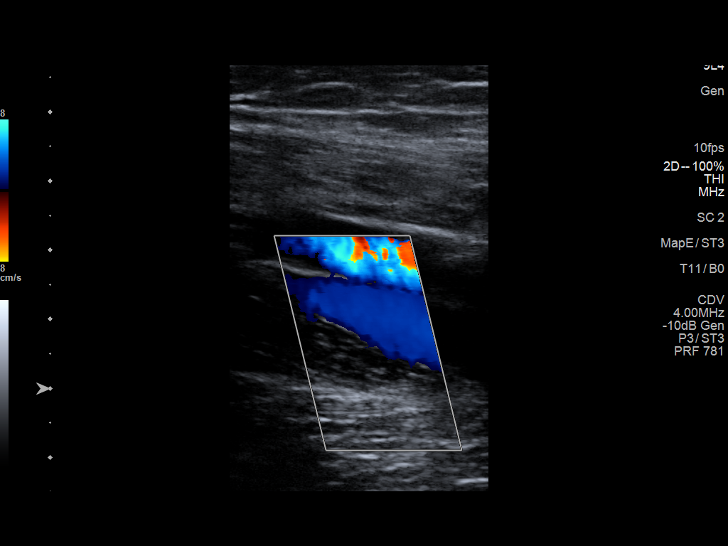
[im 31/34]
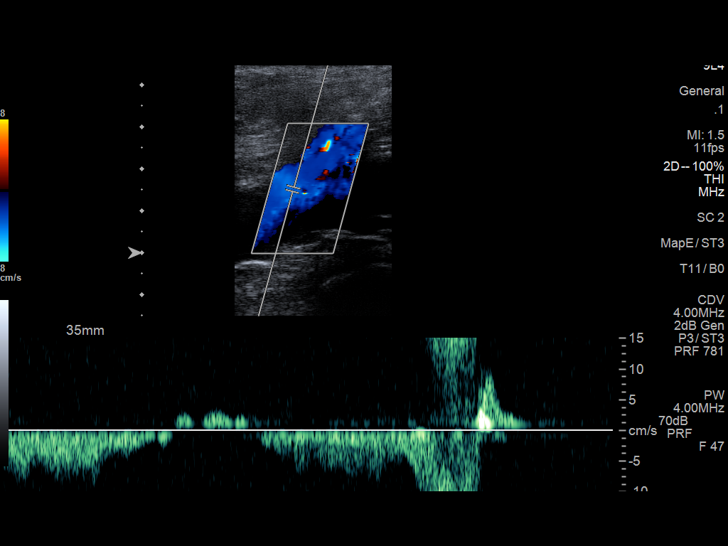
[im 34/34]
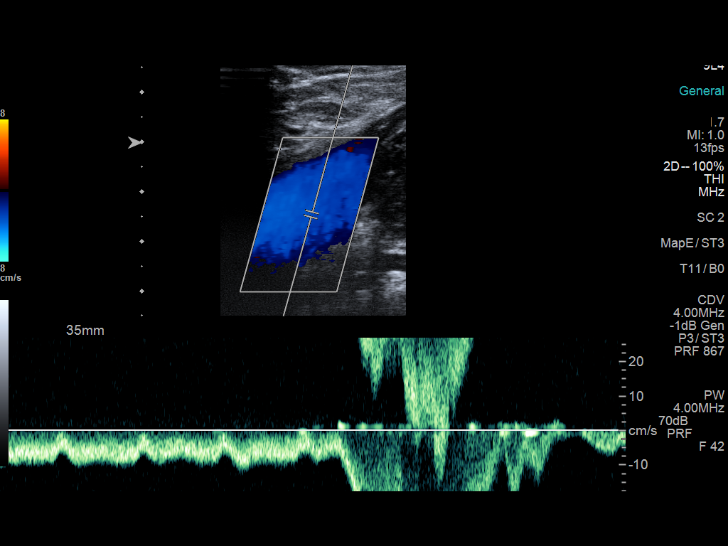

[14 of 24 positions shown; findings below may reference images not displayed]

FINDINGS: VENOUS

Normal compressibility of the common femoral, superficial femoral,
and popliteal veins, as well as the visualized calf veins.
Visualized portions of profunda femoral vein and great saphenous
vein unremarkable. No filling defects to suggest DVT on grayscale or
color Doppler imaging. Doppler waveforms show normal direction of
venous flow, normal respiratory plasticity and response to
augmentation.

Limited views of the contralateral common femoral vein are
unremarkable.

OTHER

No evidence of superficial thrombophlebitis or abnormal fluid
collection.

Limitations: none
IMPRESSION: No evidence of femoropopliteal DVT within the RIGHT lower extremity.
